# Patient Record
Sex: Female | Born: 1990 | Race: Black or African American | Hispanic: No | Marital: Single | Smoking: Current every day smoker
Health system: Southern US, Community
[De-identification: ages and names within clinical notes are randomized; demographics above are authoritative.]

## PROBLEM LIST (undated history)

## (undated) ENCOUNTER — Emergency Department: Admission: EM | Discharge status: Absent without leave | Payer: Self-pay

## (undated) DIAGNOSIS — N83209 Unspecified ovarian cyst, unspecified side: Secondary | ICD-10-CM

## (undated) DIAGNOSIS — K449 Diaphragmatic hernia without obstruction or gangrene: Secondary | ICD-10-CM

## (undated) DIAGNOSIS — I871 Compression of vein: Secondary | ICD-10-CM

## (undated) DIAGNOSIS — I10 Essential (primary) hypertension: Secondary | ICD-10-CM

## (undated) HISTORY — DX: Compression of vein: I87.1

## (undated) HISTORY — PX: ORTHOPEDIC SURGERY: SHX850

## (undated) HISTORY — DX: Essential (primary) hypertension: I10

## (undated) HISTORY — DX: Unspecified ovarian cyst, unspecified side: N83.209

## (undated) HISTORY — PX: DRUG INDUCED ENDOSCOPY: SHX6808

---
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
## 2011-01-17 ENCOUNTER — Emergency Department: Payer: Self-pay | Admitting: *Deleted

## 2011-04-11 ENCOUNTER — Emergency Department: Payer: Self-pay | Admitting: *Deleted

## 2011-04-17 ENCOUNTER — Ambulatory Visit: Payer: Self-pay | Admitting: Family Medicine

## 2011-05-10 ENCOUNTER — Emergency Department: Payer: Self-pay | Admitting: Emergency Medicine

## 2011-05-17 ENCOUNTER — Emergency Department: Payer: Self-pay

## 2011-08-27 ENCOUNTER — Inpatient Hospital Stay: Payer: Self-pay

## 2011-08-27 LAB — URINALYSIS, COMPLETE
Blood: NEGATIVE
Glucose,UR: 50 mg/dL (ref 0–75)
Ph: 6 (ref 4.5–8.0)
Protein: 100
RBC,UR: 2 /HPF (ref 0–5)
Squamous Epithelial: 4
WBC UR: 15 /HPF (ref 0–5)

## 2011-08-27 LAB — BASIC METABOLIC PANEL
Anion Gap: 11 (ref 7–16)
BUN: 7 mg/dL (ref 7–18)
Calcium, Total: 9.2 mg/dL (ref 9.0–10.7)
Chloride: 101 mmol/L (ref 98–107)
Co2: 27 mmol/L (ref 21–32)
EGFR (Non-African Amer.): >60
Glucose: 90 mg/dL (ref 65–99)
Osmolality: 275 (ref 275–301)
Potassium: 3.3 mmol/L — ABNORMAL LOW (ref 3.5–5.1)

## 2011-08-28 LAB — CBC WITH DIFFERENTIAL/PLATELET
Basophil %: 0 %
Eosinophil #: 0 10*3/uL (ref 0.0–0.7)
Eosinophil %: 0.3 %
HGB: 9.4 g/dL — ABNORMAL LOW (ref 12.0–16.0)
Lymphocyte #: 1.2 10*3/uL (ref 1.0–3.6)
MCH: 29.8 pg (ref 26.0–34.0)
MCHC: 33.8 g/dL (ref 32.0–36.0)
MCV: 88 fL (ref 80–100)
Monocyte #: 0.9 10*3/uL — ABNORMAL HIGH (ref 0.0–0.7)
Neutrophil #: 10.6 10*3/uL — ABNORMAL HIGH (ref 1.4–6.5)
Neutrophil %: 83.3 %
RDW: 11.9 % (ref 11.5–14.5)
WBC: 12.7 10*3/uL — ABNORMAL HIGH (ref 3.6–11.0)

## 2011-08-28 LAB — PROTIME-INR
INR: 1.1
Prothrombin Time: 14.3 secs (ref 11.5–14.7)

## 2011-08-29 LAB — CBC WITH DIFFERENTIAL/PLATELET
Eosinophil %: 0.4 %
HCT: 26.8 % — ABNORMAL LOW (ref 35.0–47.0)
HGB: 8.7 g/dL — ABNORMAL LOW (ref 12.0–16.0)
Lymphocyte #: 1.6 10*3/uL (ref 1.0–3.6)
MCH: 29.3 pg (ref 26.0–34.0)
Monocyte #: 0.6 10*3/uL (ref 0.0–0.7)
Monocyte %: 6.6 %
Neutrophil #: 7.5 10*3/uL — ABNORMAL HIGH (ref 1.4–6.5)
Neutrophil %: 76.5 %
Platelet: 217 10*3/uL (ref 150–440)
RBC: 2.96 10*6/uL — ABNORMAL LOW (ref 3.80–5.20)
RDW: 12.2 % (ref 11.5–14.5)

## 2011-08-29 LAB — HEPATIC FUNCTION PANEL A (ARMC)
Albumin: 2.8 g/dL — ABNORMAL LOW (ref 3.8–5.6)
Bilirubin, Direct: 0.2 mg/dL (ref 0.00–0.20)
Bilirubin,Total: 0.5 mg/dL (ref 0.2–1.0)
SGOT(AST): 21 U/L (ref 0–26)
SGPT (ALT): 25 U/L
Total Protein: 5.9 g/dL — ABNORMAL LOW (ref 6.4–8.6)

## 2011-08-29 LAB — LIPASE, BLOOD: Lipase: 135 U/L (ref 73–393)

## 2011-08-30 LAB — CBC WITH DIFFERENTIAL/PLATELET
Eosinophil #: 0.1 10*3/uL (ref 0.0–0.7)
Lymphocyte #: 1.8 10*3/uL (ref 1.0–3.6)
MCH: 29.6 pg (ref 26.0–34.0)
MCHC: 33.3 g/dL (ref 32.0–36.0)
MCV: 89 fL (ref 80–100)
Monocyte #: 0.7 10*3/uL (ref 0.0–0.7)
Monocyte %: 6.9 %
Neutrophil %: 74.2 %
Platelet: 231 10*3/uL (ref 150–440)
RDW: 12.4 % (ref 11.5–14.5)

## 2011-11-06 ENCOUNTER — Observation Stay: Payer: Self-pay | Admitting: Advanced Practice Midwife

## 2011-11-06 ENCOUNTER — Observation Stay: Payer: Self-pay

## 2011-11-07 ENCOUNTER — Inpatient Hospital Stay: Payer: Self-pay

## 2011-11-07 LAB — CBC WITH DIFFERENTIAL/PLATELET
Eosinophil #: 0 10*3/uL (ref 0.0–0.7)
HCT: 33.5 % — ABNORMAL LOW (ref 35.0–47.0)
HGB: 10.9 g/dL — ABNORMAL LOW (ref 12.0–16.0)
Lymphocyte #: 1.4 10*3/uL (ref 1.0–3.6)
Lymphocyte %: 12.4 %
MCH: 28.6 pg (ref 26.0–34.0)
MCHC: 32.6 g/dL (ref 32.0–36.0)
MCV: 88 fL (ref 80–100)
Monocyte %: 6.2 %
Neutrophil %: 80.9 %
Platelet: 214 10*3/uL (ref 150–440)
RBC: 3.83 10*6/uL (ref 3.80–5.20)
RDW: 13.1 % (ref 11.5–14.5)
WBC: 11 10*3/uL (ref 3.6–11.0)

## 2012-05-08 ENCOUNTER — Emergency Department: Payer: Self-pay | Admitting: Emergency Medicine

## 2012-05-08 LAB — COMPREHENSIVE METABOLIC PANEL
Alkaline Phosphatase: 75 U/L (ref 50–136)
Calcium, Total: 9.5 mg/dL (ref 8.5–10.1)
Co2: 23 mmol/L (ref 21–32)
EGFR (Non-African Amer.): >60
Osmolality: 271 (ref 275–301)
SGPT (ALT): 14 U/L (ref 12–78)
Sodium: 137 mmol/L (ref 136–145)

## 2012-05-08 LAB — URINALYSIS, COMPLETE
Bilirubin,UR: NEGATIVE
Glucose,UR: 50 mg/dL (ref 0–75)
Nitrite: POSITIVE
Protein: 100
RBC,UR: 1 /HPF (ref 0–5)
WBC UR: 15 /HPF (ref 0–5)

## 2012-05-08 LAB — CBC WITH DIFFERENTIAL/PLATELET
Basophil #: 0 10*3/uL (ref 0.0–0.1)
Basophil %: 0.2 %
Eosinophil #: 0 10*3/uL (ref 0.0–0.7)
Eosinophil %: 0 %
HGB: 11.7 g/dL — ABNORMAL LOW (ref 12.0–16.0)
Lymphocyte #: 0.8 10*3/uL — ABNORMAL LOW (ref 1.0–3.6)
Lymphocyte %: 5.4 %
MCH: 28.3 pg (ref 26.0–34.0)
MCHC: 33.2 g/dL (ref 32.0–36.0)
Monocyte #: 0.3 x10 3/mm (ref 0.2–0.9)
Neutrophil #: 13 10*3/uL — ABNORMAL HIGH (ref 1.4–6.5)
Neutrophil %: 92 %
Platelet: 407 10*3/uL (ref 150–440)
RBC: 4.15 10*6/uL (ref 3.80–5.20)
WBC: 14.2 10*3/uL — ABNORMAL HIGH (ref 3.6–11.0)

## 2012-05-08 LAB — HCG, QUANTITATIVE, PREGNANCY: Beta Hcg, Quant.: 33878 m[IU]/mL — ABNORMAL HIGH

## 2012-07-13 ENCOUNTER — Observation Stay: Payer: Self-pay | Admitting: Obstetrics and Gynecology

## 2012-07-13 LAB — URINALYSIS, COMPLETE
Bacteria: NONE SEEN
Bilirubin,UR: NEGATIVE
Glucose,UR: NEGATIVE mg/dL (ref 0–75)
Ph: 8 (ref 4.5–8.0)
Protein: NEGATIVE
RBC,UR: 1 /HPF (ref 0–5)
Specific Gravity: 1.017 (ref 1.003–1.030)
Squamous Epithelial: 7

## 2012-09-25 ENCOUNTER — Observation Stay: Payer: Self-pay | Admitting: Obstetrics and Gynecology

## 2012-09-26 ENCOUNTER — Observation Stay: Payer: Self-pay

## 2012-09-26 LAB — CBC WITH DIFFERENTIAL/PLATELET
Basophil #: 0 10*3/uL (ref 0.0–0.1)
Basophil %: 0.2 %
Eosinophil #: 0 10*3/uL (ref 0.0–0.7)
Eosinophil %: 0 %
Lymphocyte #: 1.2 10*3/uL (ref 1.0–3.6)
Lymphocyte %: 7.2 %
MCH: 28.7 pg (ref 26.0–34.0)
MCHC: 32.9 g/dL (ref 32.0–36.0)
Monocyte #: 0.8 x10 3/mm (ref 0.2–0.9)
Monocyte %: 4.8 %
Neutrophil #: 14.1 10*3/uL — ABNORMAL HIGH (ref 1.4–6.5)
Neutrophil %: 87.8 %
Platelet: 248 10*3/uL (ref 150–440)
RDW: 13 % (ref 11.5–14.5)

## 2012-09-26 LAB — URINALYSIS, COMPLETE
Blood: NEGATIVE
Leukocyte Esterase: NEGATIVE
Nitrite: NEGATIVE
Protein: 30
RBC,UR: <1 /HPF (ref 0–5)
Specific Gravity: 1.035 (ref 1.003–1.030)
Squamous Epithelial: 1

## 2012-09-26 LAB — HEPATIC FUNCTION PANEL A (ARMC)
Albumin: 2.9 g/dL — ABNORMAL LOW (ref 3.4–5.0)
Bilirubin, Direct: <0.1 mg/dL (ref 0.00–0.20)
SGOT(AST): 13 U/L — ABNORMAL LOW (ref 15–37)
SGPT (ALT): 11 U/L — ABNORMAL LOW (ref 12–78)

## 2012-09-26 LAB — COMPREHENSIVE METABOLIC PANEL
Albumin: 3.5 g/dL (ref 3.4–5.0)
BUN: 6 mg/dL — ABNORMAL LOW (ref 7–18)
Chloride: 104 mmol/L (ref 98–107)
Creatinine: 0.38 mg/dL — ABNORMAL LOW (ref 0.60–1.30)
EGFR (African American): >60
Glucose: 146 mg/dL — ABNORMAL HIGH (ref 65–99)
Potassium: 3.4 mmol/L — ABNORMAL LOW (ref 3.5–5.1)
SGOT(AST): 18 U/L (ref 15–37)
SGPT (ALT): 13 U/L (ref 12–78)
Total Protein: 7.3 g/dL (ref 6.4–8.2)

## 2012-09-26 LAB — AMYLASE: Amylase: 82 U/L (ref 25–115)

## 2012-09-27 LAB — URINALYSIS, COMPLETE
Bacteria: NONE SEEN
Bilirubin,UR: NEGATIVE
Glucose,UR: NEGATIVE mg/dL (ref 0–75)
Ketone: NEGATIVE
Nitrite: NEGATIVE
Ph: 9 (ref 4.5–8.0)
Protein: NEGATIVE
RBC,UR: 2 /HPF (ref 0–5)
Specific Gravity: 1.014 (ref 1.003–1.030)
Squamous Epithelial: <1
WBC UR: 1 /HPF (ref 0–5)

## 2012-09-27 LAB — GASTROCCULT (ARMC)
Occult Blood, Gastric: POSITIVE
Ph, Gastric: 4 (ref 1–3)

## 2012-09-28 LAB — CBC WITH DIFFERENTIAL/PLATELET
Eosinophil #: 0.1 10*3/uL (ref 0.0–0.7)
HCT: 28.6 % — ABNORMAL LOW (ref 35.0–47.0)
HGB: 9.6 g/dL — ABNORMAL LOW (ref 12.0–16.0)
MCH: 29.1 pg (ref 26.0–34.0)
MCHC: 33.6 g/dL (ref 32.0–36.0)
MCV: 87 fL (ref 80–100)
Monocyte #: 0.9 x10 3/mm (ref 0.2–0.9)
RBC: 3.3 10*6/uL — ABNORMAL LOW (ref 3.80–5.20)
WBC: 11.5 10*3/uL — ABNORMAL HIGH (ref 3.6–11.0)

## 2012-09-28 LAB — BASIC METABOLIC PANEL
Anion Gap: 6 — ABNORMAL LOW (ref 7–16)
BUN: 2 mg/dL — ABNORMAL LOW (ref 7–18)
Calcium, Total: 7.8 mg/dL — ABNORMAL LOW (ref 8.5–10.1)
Co2: 26 mmol/L (ref 21–32)
Creatinine: 0.43 mg/dL — ABNORMAL LOW (ref 0.60–1.30)
EGFR (African American): >60
EGFR (Non-African Amer.): >60
Glucose: 93 mg/dL (ref 65–99)
Osmolality: 270 (ref 275–301)
Potassium: 3.3 mmol/L — ABNORMAL LOW (ref 3.5–5.1)
Sodium: 137 mmol/L (ref 136–145)

## 2012-09-28 LAB — URINE CULTURE

## 2012-09-29 LAB — BASIC METABOLIC PANEL
Anion Gap: 5 — ABNORMAL LOW (ref 7–16)
BUN: 1 mg/dL — ABNORMAL LOW (ref 7–18)
EGFR (Non-African Amer.): >60
Glucose: 67 mg/dL (ref 65–99)
Osmolality: 273 (ref 275–301)
Potassium: 4 mmol/L (ref 3.5–5.1)

## 2012-09-29 LAB — CBC WITH DIFFERENTIAL/PLATELET
Basophil #: 0 10*3/uL (ref 0.0–0.1)
Basophil %: 0.4 %
Eosinophil #: 0.4 10*3/uL (ref 0.0–0.7)
HGB: 9.5 g/dL — ABNORMAL LOW (ref 12.0–16.0)
Lymphocyte %: 23.2 %
MCHC: 33.5 g/dL (ref 32.0–36.0)
MCV: 87 fL (ref 80–100)
Monocyte %: 7.6 %
Neutrophil #: 5.9 10*3/uL (ref 1.4–6.5)
Neutrophil %: 64.8 %
Platelet: 212 10*3/uL (ref 150–440)
RBC: 3.25 10*6/uL — ABNORMAL LOW (ref 3.80–5.20)
RDW: 13 % (ref 11.5–14.5)
WBC: 9.1 10*3/uL (ref 3.6–11.0)

## 2012-10-27 ENCOUNTER — Observation Stay: Payer: Self-pay | Admitting: Obstetrics and Gynecology

## 2012-10-27 LAB — URINALYSIS, COMPLETE
Blood: NEGATIVE
RBC,UR: 1 /HPF (ref 0–5)

## 2012-10-28 LAB — URINE CULTURE

## 2012-10-29 ENCOUNTER — Inpatient Hospital Stay: Payer: Self-pay | Admitting: Obstetrics and Gynecology

## 2012-10-29 DIAGNOSIS — I499 Cardiac arrhythmia, unspecified: Secondary | ICD-10-CM

## 2012-10-29 LAB — CBC WITH DIFFERENTIAL/PLATELET
Basophil #: 0 10*3/uL (ref 0.0–0.1)
Basophil %: 0.1 %
Eosinophil %: 0.2 %
HCT: 28.5 % — ABNORMAL LOW (ref 35.0–47.0)
Neutrophil %: 77 %
Platelet: 232 10*3/uL (ref 150–440)
RDW: 12.7 % (ref 11.5–14.5)

## 2012-10-29 LAB — BASIC METABOLIC PANEL
BUN: 5 mg/dL — ABNORMAL LOW (ref 7–18)
Co2: 28 mmol/L (ref 21–32)
Creatinine: 0.42 mg/dL — ABNORMAL LOW (ref 0.60–1.30)
EGFR (Non-African Amer.): >60
Osmolality: 267 (ref 275–301)
Potassium: 3.2 mmol/L — ABNORMAL LOW (ref 3.5–5.1)

## 2012-10-29 LAB — URINALYSIS, COMPLETE
Bilirubin,UR: NEGATIVE
Blood: NEGATIVE
Nitrite: NEGATIVE
Ph: 7 (ref 4.5–8.0)
Squamous Epithelial: 4
WBC UR: 3 /HPF (ref 0–5)

## 2012-10-29 LAB — GASTROCCULT (ARMC): Occult Blood, Gastric: POSITIVE

## 2012-10-30 LAB — CBC WITH DIFFERENTIAL/PLATELET
Basophil %: 0.3 %
HCT: 28 % — ABNORMAL LOW (ref 35.0–47.0)
HGB: 9.3 g/dL — ABNORMAL LOW (ref 12.0–16.0)
Lymphocyte #: 0.6 10*3/uL — ABNORMAL LOW (ref 1.0–3.6)
MCH: 28.8 pg (ref 26.0–34.0)
MCV: 87 fL (ref 80–100)
Neutrophil %: 78.6 %
Platelet: 190 10*3/uL (ref 150–440)
RBC: 3.23 10*6/uL — ABNORMAL LOW (ref 3.80–5.20)
RDW: 12.9 % (ref 11.5–14.5)
WBC: 6.9 10*3/uL (ref 3.6–11.0)

## 2012-10-30 LAB — COMPREHENSIVE METABOLIC PANEL
Albumin: 2.6 g/dL — ABNORMAL LOW (ref 3.4–5.0)
Anion Gap: 7 (ref 7–16)
Bilirubin,Total: 0.8 mg/dL (ref 0.2–1.0)
Creatinine: 0.49 mg/dL — ABNORMAL LOW (ref 0.60–1.30)
EGFR (African American): >60
EGFR (Non-African Amer.): >60
Glucose: 97 mg/dL (ref 65–99)
Osmolality: 270 (ref 275–301)
SGPT (ALT): 19 U/L (ref 12–78)
Sodium: 137 mmol/L (ref 136–145)
Total Protein: 5.7 g/dL — ABNORMAL LOW (ref 6.4–8.2)

## 2012-10-31 LAB — CBC WITH DIFFERENTIAL/PLATELET
Basophil %: 0.5 %
Eosinophil #: 0 10*3/uL (ref 0.0–0.7)
HCT: 30.2 % — ABNORMAL LOW (ref 35.0–47.0)
HGB: 10 g/dL — ABNORMAL LOW (ref 12.0–16.0)
Lymphocyte #: 0.6 10*3/uL — ABNORMAL LOW (ref 1.0–3.6)
Lymphocyte %: 13.4 %
MCV: 86 fL (ref 80–100)
Monocyte #: 0.6 x10 3/mm (ref 0.2–0.9)
Monocyte %: 13.6 %
Neutrophil #: 3.3 10*3/uL (ref 1.4–6.5)
Neutrophil %: 72.2 %
RBC: 3.51 10*6/uL — ABNORMAL LOW (ref 3.80–5.20)
RDW: 12.8 % (ref 11.5–14.5)

## 2012-10-31 LAB — BASIC METABOLIC PANEL
BUN: <1 mg/dL — ABNORMAL LOW (ref 7–18)
Calcium, Total: 8.7 mg/dL (ref 8.5–10.1)
EGFR (African American): >60
Glucose: 77 mg/dL (ref 65–99)
Potassium: 3.8 mmol/L (ref 3.5–5.1)

## 2012-11-03 ENCOUNTER — Inpatient Hospital Stay: Payer: Self-pay | Admitting: Obstetrics and Gynecology

## 2012-11-03 LAB — CBC WITH DIFFERENTIAL/PLATELET
Basophil #: 0 10*3/uL (ref 0.0–0.1)
Eosinophil #: 0 10*3/uL (ref 0.0–0.7)
HGB: 10.2 g/dL — ABNORMAL LOW (ref 12.0–16.0)
Lymphocyte #: 0.8 10*3/uL — ABNORMAL LOW (ref 1.0–3.6)
MCH: 28.1 pg (ref 26.0–34.0)
MCHC: 33 g/dL (ref 32.0–36.0)
MCV: 85 fL (ref 80–100)
Monocyte #: 0.3 x10 3/mm (ref 0.2–0.9)
Neutrophil %: 82.7 %
Platelet: 211 10*3/uL (ref 150–440)
RDW: 13 % (ref 11.5–14.5)

## 2012-11-03 LAB — BASIC METABOLIC PANEL
Anion Gap: 9 (ref 7–16)
BUN: 4 mg/dL — ABNORMAL LOW (ref 7–18)
Co2: 24 mmol/L (ref 21–32)
Creatinine: 0.32 mg/dL — ABNORMAL LOW (ref 0.60–1.30)
Osmolality: 272 (ref 275–301)

## 2012-11-03 LAB — GC/CHLAMYDIA PROBE AMP

## 2012-11-03 LAB — GASTROCCULT (ARMC): Ph, Gastric: 2 (ref 1–3)

## 2012-11-04 LAB — BASIC METABOLIC PANEL
BUN: 2 mg/dL — ABNORMAL LOW (ref 7–18)
Chloride: 108 mmol/L — ABNORMAL HIGH (ref 98–107)
Co2: 23 mmol/L (ref 21–32)
Creatinine: 0.4 mg/dL — ABNORMAL LOW (ref 0.60–1.30)
EGFR (African American): >60
Glucose: 98 mg/dL (ref 65–99)
Osmolality: 272 (ref 275–301)
Potassium: 3.6 mmol/L (ref 3.5–5.1)

## 2012-11-04 LAB — CBC WITH DIFFERENTIAL/PLATELET
Basophil #: 0 10*3/uL (ref 0.0–0.1)
Eosinophil #: 0 10*3/uL (ref 0.0–0.7)
Eosinophil %: 0.3 %
Lymphocyte %: 27.5 %
MCH: 28.5 pg (ref 26.0–34.0)
MCHC: 33.2 g/dL (ref 32.0–36.0)
MCV: 86 fL (ref 80–100)
Neutrophil #: 3.5 10*3/uL (ref 1.4–6.5)
Neutrophil %: 60.4 %
Platelet: 190 10*3/uL (ref 150–440)
RBC: 3.28 10*6/uL — ABNORMAL LOW (ref 3.80–5.20)
RDW: 13 % (ref 11.5–14.5)

## 2012-11-05 LAB — BASIC METABOLIC PANEL
Anion Gap: 8 (ref 7–16)
BUN: 2 mg/dL — ABNORMAL LOW (ref 7–18)
Calcium, Total: 8.5 mg/dL (ref 8.5–10.1)
Chloride: 108 mmol/L — ABNORMAL HIGH (ref 98–107)
Co2: 23 mmol/L (ref 21–32)
Creatinine: 0.57 mg/dL — ABNORMAL LOW (ref 0.60–1.30)
EGFR (African American): >60
EGFR (Non-African Amer.): >60
Glucose: 82 mg/dL (ref 65–99)
Osmolality: 273 (ref 275–301)
Potassium: 3.5 mmol/L (ref 3.5–5.1)

## 2012-11-06 LAB — HEMATOCRIT: HCT: 26.3 % — ABNORMAL LOW (ref 35.0–47.0)

## 2013-02-27 ENCOUNTER — Emergency Department: Payer: Self-pay | Admitting: Emergency Medicine

## 2013-02-27 LAB — CBC WITH DIFFERENTIAL/PLATELET
Basophil #: 0 10*3/uL (ref 0.0–0.1)
Eosinophil %: 0 %
HCT: 39.7 % (ref 35.0–47.0)
HGB: 13.4 g/dL (ref 12.0–16.0)
Lymphocyte #: 2 10*3/uL (ref 1.0–3.6)
MCV: 83 fL (ref 80–100)
Monocyte #: 0.6 x10 3/mm (ref 0.2–0.9)
Neutrophil %: 73.8 %
RBC: 4.8 10*6/uL (ref 3.80–5.20)

## 2013-02-27 LAB — URINALYSIS, COMPLETE
Bilirubin,UR: NEGATIVE
Nitrite: NEGATIVE
RBC,UR: 52 /HPF (ref 0–5)

## 2013-02-27 LAB — LIPASE, BLOOD: Lipase: 77 U/L (ref 73–393)

## 2013-02-27 LAB — COMPREHENSIVE METABOLIC PANEL
Alkaline Phosphatase: 74 U/L (ref 50–136)
BUN: 10 mg/dL (ref 7–18)
Bilirubin,Total: 0.7 mg/dL (ref 0.2–1.0)
Co2: 32 mmol/L (ref 21–32)
Creatinine: 0.75 mg/dL (ref 0.60–1.30)
EGFR (Non-African Amer.): >60
Sodium: 139 mmol/L (ref 136–145)

## 2013-04-28 ENCOUNTER — Emergency Department: Payer: Self-pay | Admitting: Emergency Medicine

## 2013-04-28 LAB — COMPREHENSIVE METABOLIC PANEL
Albumin: 5.3 g/dL — ABNORMAL HIGH (ref 3.4–5.0)
Alkaline Phosphatase: 83 U/L
Anion Gap: 5 — ABNORMAL LOW (ref 7–16)
Bilirubin,Total: 0.9 mg/dL (ref 0.2–1.0)
Calcium, Total: 10.4 mg/dL — ABNORMAL HIGH (ref 8.5–10.1)
EGFR (African American): >60
EGFR (Non-African Amer.): >60
Glucose: 133 mg/dL — ABNORMAL HIGH (ref 65–99)
Osmolality: 274 (ref 275–301)
Potassium: 3.2 mmol/L — ABNORMAL LOW (ref 3.5–5.1)
SGPT (ALT): 23 U/L (ref 12–78)
Total Protein: 9.9 g/dL — ABNORMAL HIGH (ref 6.4–8.2)

## 2013-04-28 LAB — CBC WITH DIFFERENTIAL/PLATELET
Basophil #: 0 10*3/uL (ref 0.0–0.1)
Eosinophil #: 0 10*3/uL (ref 0.0–0.7)
Eosinophil %: 0 %
Lymphocyte #: 1.3 10*3/uL (ref 1.0–3.6)
MCH: 28.1 pg (ref 26.0–34.0)
MCHC: 33.2 g/dL (ref 32.0–36.0)
MCV: 85 fL (ref 80–100)
Monocyte #: 0.8 x10 3/mm (ref 0.2–0.9)
Neutrophil %: 82.6 %
RDW: 13.6 % (ref 11.5–14.5)

## 2013-04-28 LAB — LIPASE, BLOOD: Lipase: 105 U/L (ref 73–393)

## 2013-04-29 LAB — URINALYSIS, COMPLETE
Bilirubin,UR: NEGATIVE
Glucose,UR: NEGATIVE mg/dL (ref 0–75)
Hyaline Cast: 16
Nitrite: NEGATIVE
RBC,UR: 5 /HPF (ref 0–5)
WBC UR: 4 /HPF (ref 0–5)

## 2013-04-29 LAB — ETHANOL
Ethanol %: <0.003 % (ref 0.000–0.080)
Ethanol: <3 mg/dL

## 2013-12-08 ENCOUNTER — Emergency Department: Payer: Self-pay | Admitting: Emergency Medicine

## 2013-12-09 LAB — CBC WITH DIFFERENTIAL/PLATELET
Basophil #: 0.1 10*3/uL (ref 0.0–0.1)
Basophil %: 0.8 %
Eosinophil #: 0.1 10*3/uL (ref 0.0–0.7)
Eosinophil %: 0.5 %
HCT: 35.1 % (ref 35.0–47.0)
HGB: 11.4 g/dL — ABNORMAL LOW (ref 12.0–16.0)
Lymphocyte #: 1.7 10*3/uL (ref 1.0–3.6)
Lymphocyte %: 15.6 %
MCH: 28.5 pg (ref 26.0–34.0)
MCHC: 32.4 g/dL (ref 32.0–36.0)
MCV: 88 fL (ref 80–100)
Monocyte #: 0.4 x10 3/mm (ref 0.2–0.9)
Monocyte %: 3.6 %
NEUTROS PCT: 79.5 %
Neutrophil #: 8.7 10*3/uL — ABNORMAL HIGH (ref 1.4–6.5)
PLATELETS: 297 10*3/uL (ref 150–440)
RBC: 3.99 10*6/uL (ref 3.80–5.20)
RDW: 12 % (ref 11.5–14.5)
WBC: 11 10*3/uL (ref 3.6–11.0)

## 2013-12-09 LAB — URINALYSIS, COMPLETE
BLOOD: NEGATIVE
Bilirubin,UR: NEGATIVE
Glucose,UR: NEGATIVE mg/dL (ref 0–75)
Ketone: NEGATIVE
LEUKOCYTE ESTERASE: NEGATIVE
Nitrite: NEGATIVE
PH: 7 (ref 4.5–8.0)
PROTEIN: NEGATIVE
RBC,UR: <1 /HPF (ref 0–5)
Specific Gravity: 1.016 (ref 1.003–1.030)
Squamous Epithelial: 21
WBC UR: 4 /HPF (ref 0–5)

## 2013-12-09 LAB — BASIC METABOLIC PANEL
Anion Gap: 6 — ABNORMAL LOW (ref 7–16)
BUN: 6 mg/dL — AB (ref 7–18)
CALCIUM: 8.4 mg/dL — AB (ref 8.5–10.1)
CO2: 25 mmol/L (ref 21–32)
Chloride: 108 mmol/L — ABNORMAL HIGH (ref 98–107)
Creatinine: 0.75 mg/dL (ref 0.60–1.30)
EGFR (African American): >60
GLUCOSE: 94 mg/dL (ref 65–99)
Osmolality: 275 (ref 275–301)
POTASSIUM: 3.4 mmol/L — AB (ref 3.5–5.1)
Sodium: 139 mmol/L (ref 136–145)

## 2013-12-09 LAB — TROPONIN I: Troponin-I: <0.02 ng/mL

## 2014-02-10 IMAGING — US ABDOMEN ULTRASOUND
1 series · 14 of 25 positions shown · non-contrast
Comparison: none

REASON FOR EXAM: Look for gallstones, pt will not be NPO for 8 hours
COMMENTS:

[Series 1: abdomen ultrasound · 0.31mm/px · 14 of 77 slices shown]
[im 1/77]
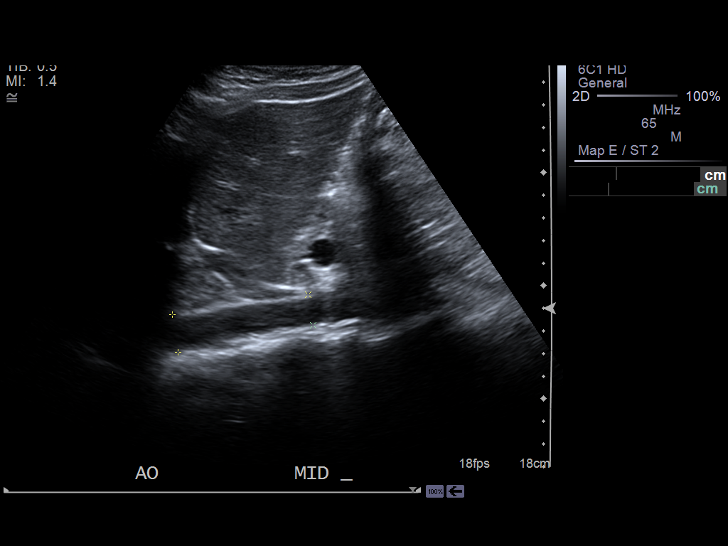
[im 7/77]
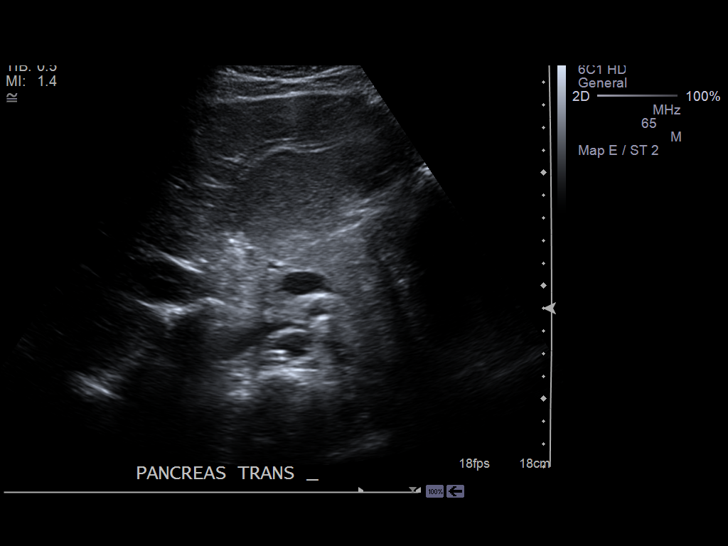
[im 13/77]
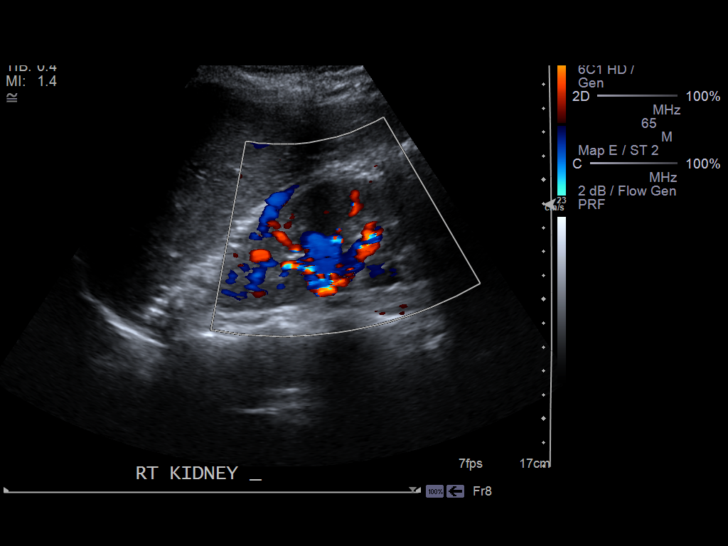
[im 20/77]
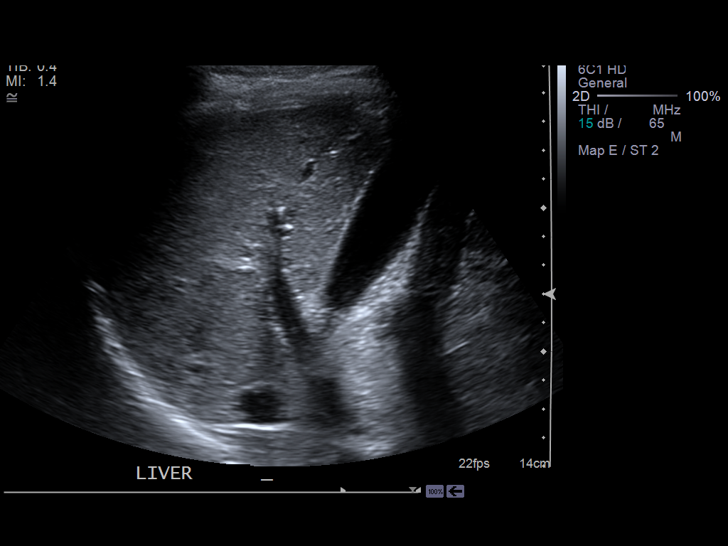
[im 26/77]
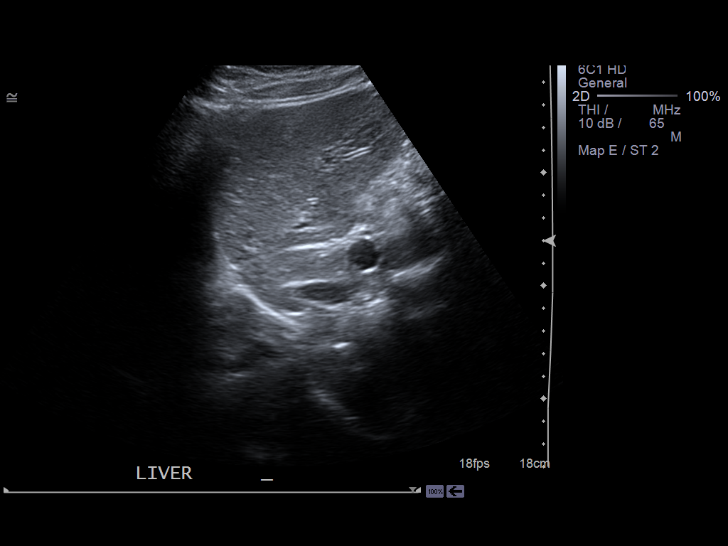
[im 29/77]
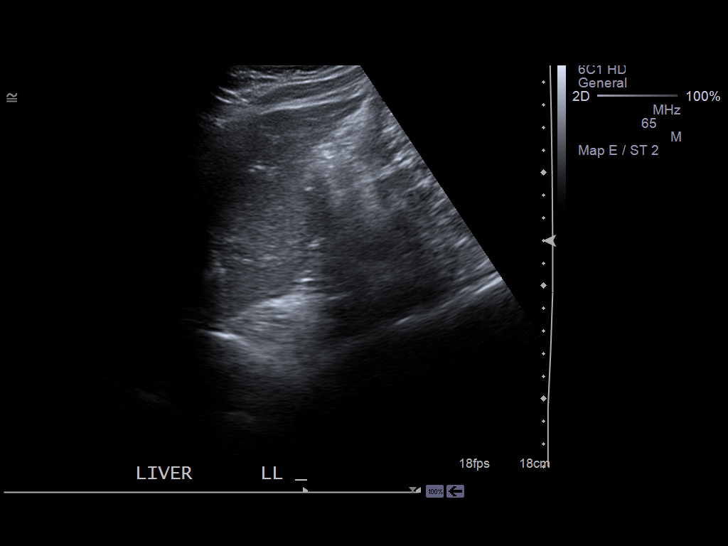
[im 35/77]
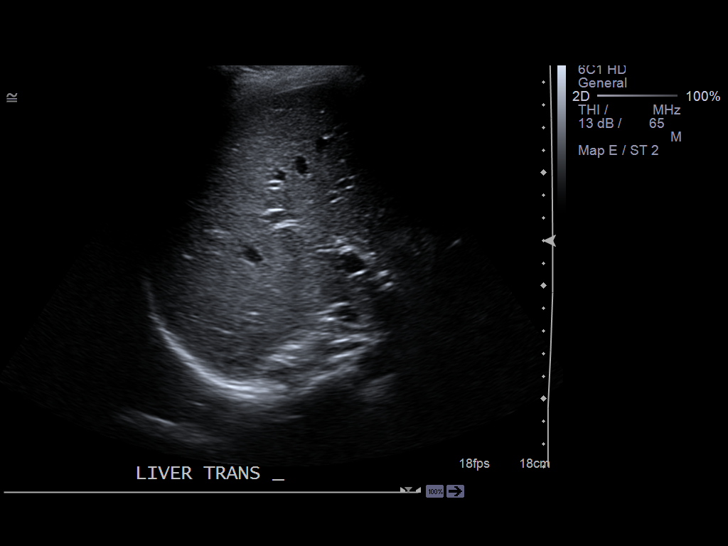
[im 42/77]
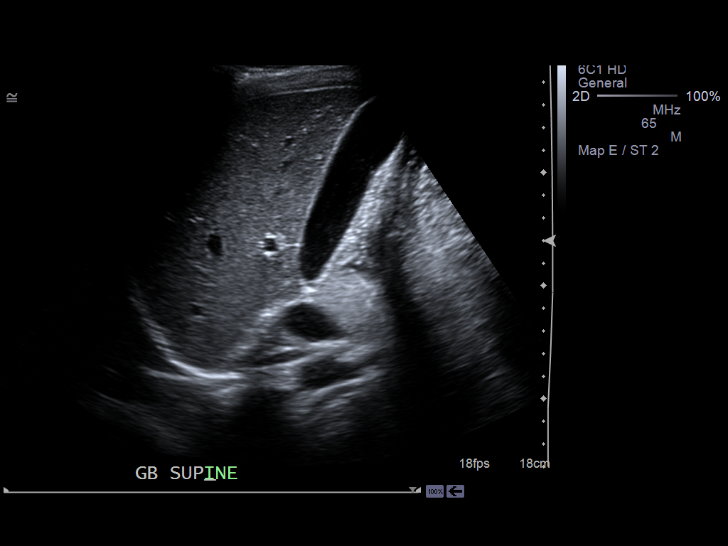
[im 48/77]
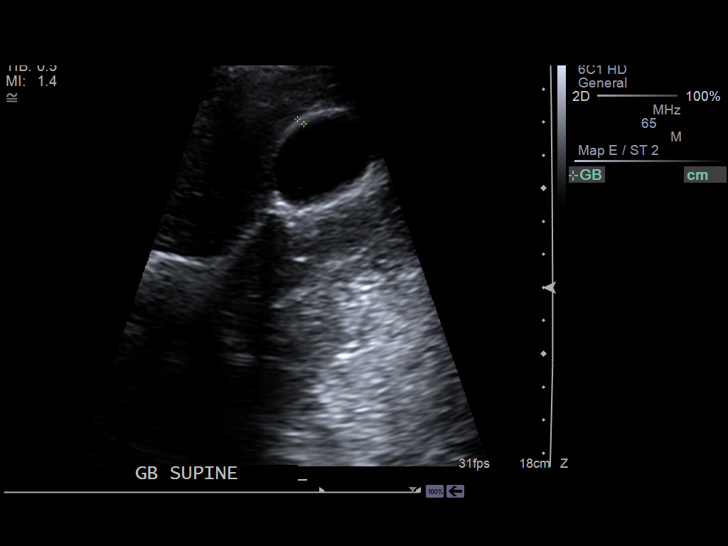
[im 51/77]
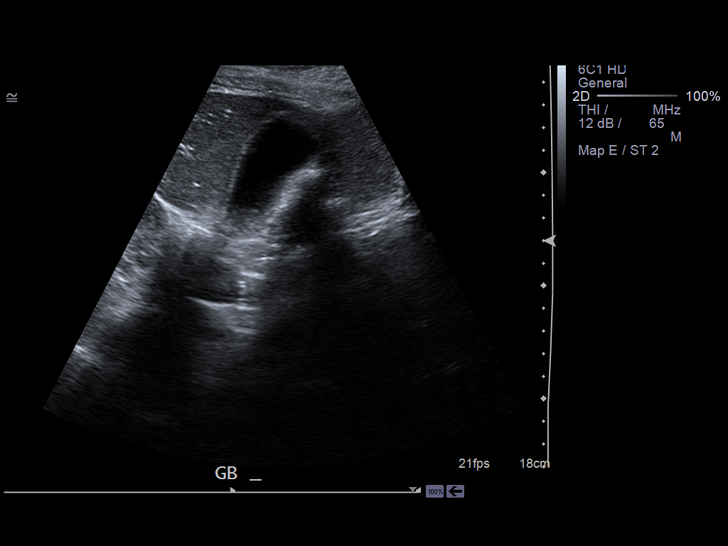
[im 58/77]
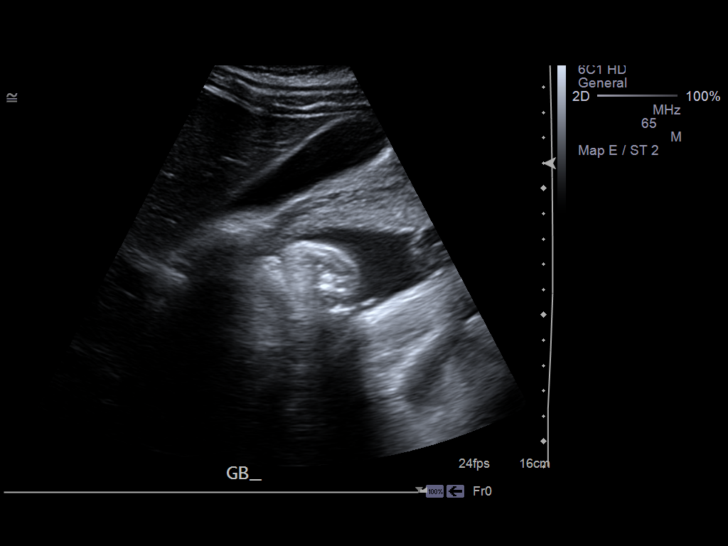
[im 64/77]
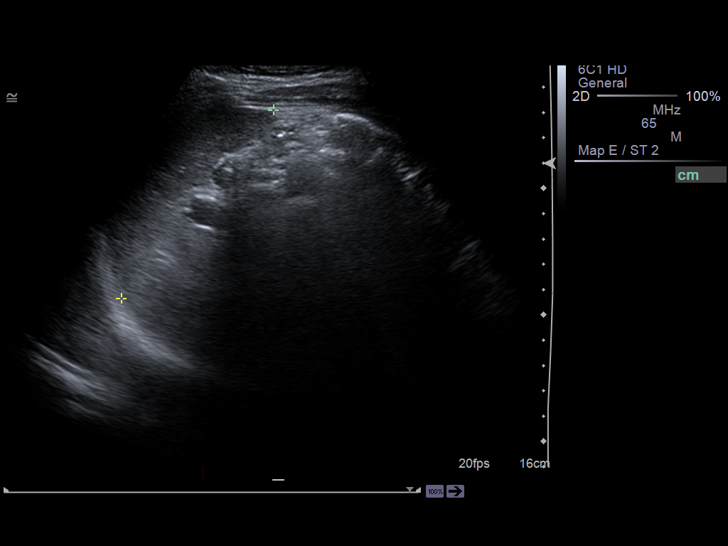
[im 70/77]
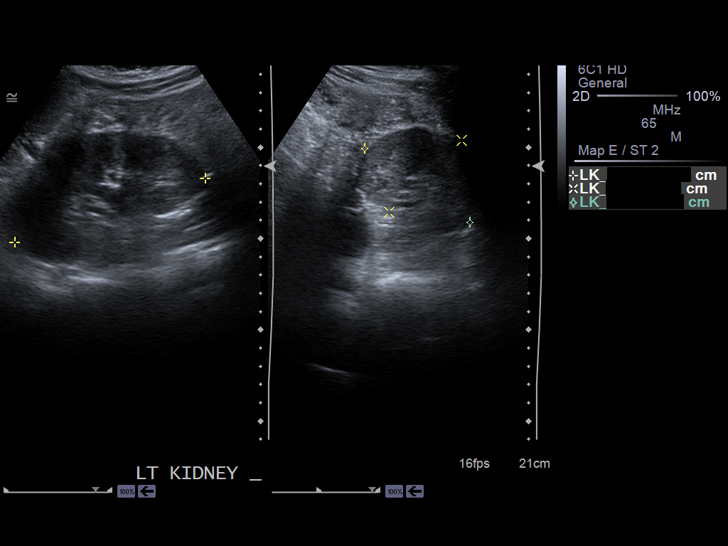
[im 77/77]
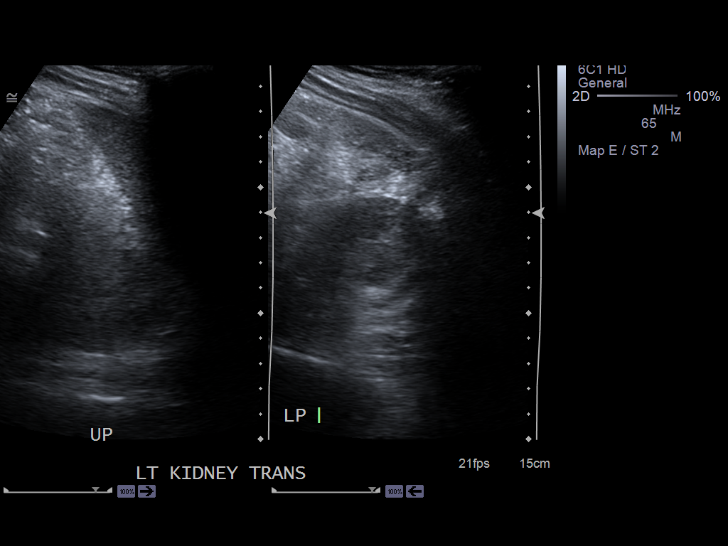

[14 of 25 positions shown; findings below may reference images not displayed]

PROCEDURE:     US  - US ABDOMEN GENERAL SURVEY  - October 29, 2012 [DATE]

RESULT:     Abdominal ultrasound demonstrates a normal appearance of the
abdominal aorta. The visualized pancreas appears normal. The tail is
incompletely seen area the right kidney measures 10.58 x 5.01 x 4.57 cm with
a cortical thickness measured at 1.03 cm. The liver length is 13.92 cm. The
liver shows a normal echotexture. There is no discrete mass or ductal
dilation. The portal venous flow is normal. Common bile duct diameter is
to 2.4 mm. No gallstones are evident. Gallbladder wall thickness is 2.4 mm.
There is no pericholecystic fluid. A negative sonographic Murphy's sign is
reported. The spleen shows a normal echotexture measuring 9.57 cm. The left
kidney measures 11.02 x 5.59 x 7.05 cm with a cortical thickness of 1.28 cm.
There is no ascites.
IMPRESSION: Normal appearing abdominal sonogram.

[REDACTED]

## 2014-09-18 NOTE — Consult Note (Signed)
CC: hematemesis, vomiting, heartburn.  Pt to be induced soon.  I will likely do EGD Wednesday depending on the condition of the patient.   Electronic Signatures: Scot JunElliott, Fredi Geiler T (MD)  (Signed on 09-Jun-14 19:31)  Authored  Last Updated: 09-Jun-14 19:31 by Scot JunElliott, Valena Ivanov T (MD)

## 2014-09-18 NOTE — Consult Note (Signed)
CC: hematemesis.   Pt had esophagitis and focal gastritis on EGD.  Should be on omeprazole 20mg  bid, avoid eating and lying down.  Should eat verys soft diet.  Will see in follow up in 3-4 weeks.  May go home tomorrow.  i will be out of town starting tomorrow morning.  Electronic Signatures: Scot JunElliott, Amadi Frady T (MD)  (Signed on 11-Jun-14 12:02)  Authored  Last Updated: 11-Jun-14 12:02 by Scot JunElliott, Zendayah Hardgrave T (MD)

## 2014-09-18 NOTE — Consult Note (Signed)
PATIENT NAME:  Kimberly Hansen, Kimberly Hansen MR#:  [id number] DATE OF BIRTH:  18-Jan-1991  GASTROENTEROLOGY CONSULTATION  DATE OF CONSULTATION:  09/28/2012  CONSULTING PHYSICIAN:  Ezzard StandingPaul Y. Aldair Rickel, MD  REASON FOR REFERRAL: Nausea and vomiting, chest pain, and hematemesis.   The patient is a 24 year old black female who is approximately 2131 weeks' pregnant who comes in with nausea and vomiting after eating dinner. She also had a few episodes of what appears to be hematemesis as well. She has been experiencing a lot of epigastric chest discomfort along with some odynophagia. The patient was immediately started on Prilosec, 40 mg daily. Despite this, she still has this pressure in her chest that has not gone away. The nausea and vomiting has stopped.   The patient had a similar episode last year when she was pregnant. She was seen by Dr. Marva PandaSkulskie at the time. The patient was given nausea medication and a PPI, which relieved the symptoms. She does not recall being placed on medication after discharge. She was not on anything when she presented to the Emergency Room for her reflux.   The patient has no other past medical history.   MEDICATIONS: The only medications she is on is prenatal vitamins.   She had no surgical history.   She has no allergies to medication.   She does not smoke or drink.   REVIEW OF SYMPTOMS: There are no fevers or chills. She thought she lost weight, but her weight has actually gone up through her pregnancy. There is no coughing or shortness of breath. She does have some chest pain, but no palpitations. She did have some odynophagia, but denies dysphagia. She denies any low abdominal pain or cramping. There is no diarrhea or constipation. There is no hematochezia or melena.   The rest of the review of symptoms is negative.   PHYSICAL EXAMINATION: GENERAL: The patient is in no acute distress.  VITAL SIGNS: Her vital signs are stable.  HEENT: Normocephalic, atraumatic head. Pupils are  equally reactive. Throat was clear.  NECK: Supple.  CARDIAC: Showed a regular rhythm rate without murmurs.  LUNGS: Clear bilaterally.  ABDOMEN: Normoactive bowel sounds. Soft, nontender. She is gravid. She had active bowel sounds.  EXTREMITIES: Showed no clubbing, cyanosis, or edema.   LABS: Sodium 137, potassium 3.3; this is from May 3rd. Creatinine 0.43, chloride 105. Liver enzymes were normal.   White count is 11.5, hemoglobin was 9.6. Urine is negative.   Hepatitis A is negative. Hepatitis Hansen core antibody is negative. Gastroccult for vomiting, emesis was positive for blood. PH was 4.   ASSESSMENT AND PLAN: This is a 24 year old female who is 1631 weeks' pregnant, who has nausea, vomiting, and hematemesis and chest pain. This is likely to be related to gastroesophageal reflux disease which often worsens during pregnancy/the later part of pregnancy. It is possible with all the retching she may have a small Mallory-Weiss tear. She could very well have reflux esophagitis as well. The symptoms are similar to last year when she was pregnant. So far she has not responded to Prilosec. Therefore I switched the medication to Protonix IV twice a day. If there is no relief I can add Carafate, 1 gram 4 times a day before meals. I would not recommend endoscopy right now because of her pregnancy. Sedation can either induce labor or may have potential harm to the baby. Certainly, if the symptoms worsen where she is having active bleeding, we can reconsider endoscopy. I doubt that will  be necessary.  I recommend that she continue with the nausea medication plus at least daily proton pump inhibitor, even when she is ready for discharge at home. She may need it at least throughout the duration of her pregnancy. I will continue to follow the patient. Thank you for the referral.    ____________________________ Ezzard Standing. Bluford Kaufmann, MD pyo:dm D: 09/29/2012 08:19:00 ET T: 09/29/2012 08:46:40 ET JOB#: [id number]  cc: Ezzard Standing. Bluford Kaufmann,  MD, <Dictator> Ezzard Standing Laporscha Linehan MD ELECTRONICALLY SIGNED 09/30/2012 13:19

## 2014-09-18 NOTE — Consult Note (Signed)
Chief Complaint:  Subjective/Chief Complaint Feeling much better. Less CP. Tolerating liquid diet.   VITAL SIGNS/ANCILLARY NOTES: **Vital Signs.:   04-May-14 09:31  Vital Signs Type Routine  Temperature Temperature (F) 98  Celsius 36.6  Temperature Source oral  Pulse Pulse 105  Respirations Respirations 18  Systolic BP Systolic BP 106  Diastolic BP (mmHg) Diastolic BP (mmHg) 69  Mean BP 81   Brief Assessment:  Cardiac Regular   Respiratory clear BS   Gastrointestinal gravid. nontender   Lab Results: Routine Micro:  02-May-14 11:22   Micro Text Report URINE CULTURE   COMMENT                   NO GROWTH IN 18-24 HOURS   ANTIBIOTIC                       Specimen Source i/o cath  Culture Comment NO GROWTH IN 18-24 HOURS  Result(s) reported on 28 Sep 2012 at 03:17PM.  Routine UA:  02-May-14 11:22   Color (UA) Yellow  Clarity (UA) Clear  Glucose (UA) Negative  Bilirubin (UA) Negative  Ketones (UA) Negative  Specific Gravity (UA) 1.014  Blood (UA) Negative  pH (UA) 9.0  Protein (UA) Negative  Nitrite (UA) Negative  Leukocyte Esterase (UA) Negative (Result(s) reported on 27 Sep 2012 at 11:51AM.)  RBC (UA) 2 /HPF  WBC (UA) 1 /HPF  Bacteria (UA) NONE SEEN  Epithelial Cells (UA) <1 /HPF  Mucous (UA) PRESENT (Result(s) reported on 27 Sep 2012 at 11:51AM.)   Assessment/Plan:  Assessment/Plan:  Assessment GERD. Much better on protonix bid. IV fell. PO protonix given this AM.   Plan Advance diet. If tolerates solids, ok for discharge later today. Wrote script for protonix 40mg  bid qac. May need nausea meds prn. Have patient f/u with us in several weeks. If doing well, may decrease protonix to once daily for rest of her pregnancy. Will sign off. Thanks.   Electronic Signatures: Lutricia Feilh, Arhum Peeples (MD)  (Signed [phone]-May-14 10:12)  Authored: Chief Complaint, VITAL SIGNS/ANCILLARY NOTES, Brief Assessment, Lab Results, Assessment/Plan   Last Updated: 04-May-14 10:12 by Lutricia Feilh, Talishia Betzler  (MD)

## 2014-09-18 NOTE — Consult Note (Signed)
Pt seen and examined. Full consult to follow. [redacted] wk pregnant female with nausea/vomiting/hematemesis/chest pain. Likely GERD sxs. May have small M-W esophageal tear or reflux esophagitis. Similar sxs last year during pregnancy. Started po prilosec yest. So far, no relief. Switched to protonix iv bid. If no relief, will add carafate 1 qid qac. I would NOT recommend EGD right now but willing to do so later if sxs worsen over time. Recommend nausea meds plus PPI daily when ready for discharge. Will follow. Thanks.  Electronic Signatures: Lutricia Feilh, Zayde Stroupe (MD)  (Signed on 03-May-14 09:59)  Authored  Last Updated: 03-May-14 09:59 by Lutricia Feilh, Chasin Findling (MD)

## 2014-09-18 NOTE — Discharge Summary (Signed)
PATIENT NAME:  Princess BruinsJENNINGS, Trenton B MR#:  [id number] DATE OF BIRTH:  09-18-1990  NARRATIVE SUMMARY  DATE OF ADMISSION:  10/29/2012 DATE OF DISCHARGE:  10/31/2012  PRINCIPAL DIAGNOSES:  1.  Severe gastritis.  2.  Hematemesis.  3.  Hypokalemia.   DISCHARGE DIAGNOSES: 1.  Severe gastritis, with resolving symptoms 2.  Hematemesis, with resolving symptoms.  3.  Hypokalemia, with resolving symptoms.   HOSPITAL COURSE: The patient was admitted with a 2-day history of nausea, vomiting, and some blood-streaked vomitus. The patient was admitted to postpartum for IV hydration. The patient's potassium was initially low at 3.2; required potassium supplementation intravenously. On discharge, potassium of 3.8, sodium 138. The patient's nausea and vomiting resolved with intravenous fluids, and IV Phenergan and Zofran. The patient had no vomitus the last 24 hours.   The patient is discharged to home in good condition. The patient will follow up and be admitted to the hospital on June 09th in the early evening for induction, for June 10th she will be 37 weeks at that time. The patient is aware of the risk and benefits of early induction versus continuation with her severe gastritis. The patient will be discharged to home on Protonix, and Zofran and Phenergan. Precautions were given to the patient.   The patient will undergo a non-stress test before discharge today.    ____________________________ Suzy Bouchardhomas J. Anet Logsdon, MD tjs:dm D: 10/31/2012 14:31:18 ET T: 10/31/2012 15:33:12 ET JOB#: [id number]  cc: Suzy Bouchardhomas J. Jaeli Grubb, MD, <Dictator> Suzy BouchardHOMAS J Hanif Radin MD ELECTRONICALLY SIGNED 11/08/2012 20:58

## 2014-09-20 NOTE — Discharge Summary (Signed)
PATIENT NAME:  Kimberly Hansen, Letishia B MR#:  [id number] DATE OF BIRTH:  [birth year]  DATE OF ADMISSION:  08/27/2011 DATE OF DISCHARGE:  09/01/2011  HISTORY OF PRESENT ILLNESS: This is a 24 year old gravida 1 whose EDC is 11/20/2011. The patient is being followed at [facility] for this pregnancy. The patient had hyperemesis the earlier part of this pregnancy. The patient presented with a 24-hour history of increasing nausea and vomiting where she is vomiting every 30 minutes.   PAST MEDICAL HISTORY:  MEDICAL ILLNESS: Denied.   SURGERY: Denied.   ALLERGIES: Denied.   MEDICATIONS: Prenatal vitamins.   HOSPITAL COURSE: The patient was admitted, was seen through kind consultation with the GI service. The patient ultimately responded to her therapy and was discharged on April 5th in excellent condition.   DIET: Regular diet.  DISCHARGE INSTRUCTIONS: The patient is to return to the [facility] for follow-up care.   ____________________________ Deloris PingPhilip J. Luella Cookosenow, MD pjr:drc D: 09/26/2011 10:02:04 ET T: 09/26/2011 10:59:24 ET JOB#: [id number]  cc: Deloris PingPhilip J. Luella Cookosenow, MD, <Dictator> Towana BadgerPHILIP J ROSENOW MD ELECTRONICALLY SIGNED 09/30/2011 15:53

## 2014-09-20 NOTE — Consult Note (Signed)
Chief Complaint:   Subjective/Chief Complaint c/o continued nausea, 2 episodes of emesis today, no bright red hematemesis.  continues with generalized abdominal pain/discomfort. Marland Kitchen.   VITAL SIGNS/ANCILLARY NOTES: **Vital Signs.:   02-Apr-13 15:35   Vital Signs Type Routine   Temperature Temperature (F) 99.3   Celsius 37.3   Temperature Source oral   Pulse Pulse 89   Pulse source per Dinamap   Respirations Respirations 18   Systolic BP Systolic BP 119   Diastolic BP (mmHg) Diastolic BP (mmHg) 58   Mean BP 78   BP Source Dinamap    16:30   Vital Signs Type Routine   Temperature Temperature (F) 98.7   Celsius 37   Temperature Source oral   Pulse Pulse 78   Pulse source per Dinamap   Respirations Respirations 18   Systolic BP Systolic BP 108   Diastolic BP (mmHg) Diastolic BP (mmHg) 62   Mean BP 77   BP Source Dinamap   Fetal Heart Tones  156   Brief Assessment:   Cardiac Regular    Respiratory clear BS    Gastrointestinal details normal Soft  Nondistended  No masses palpable  Bowel sounds normal  No rebound tenderness  No gaurding  mild tenderness across entire abdomen, gravid   Routine Hem:  02-Apr-13 04:33    WBC (CBC) 9.8   RBC (CBC) 2.96   Hemoglobin (CBC) 8.7   Hematocrit (CBC) 26.8   Platelet Count (CBC) 217   MCV 91   MCH 29.3   MCHC 32.4   RDW 12.2   Neutrophil % 76.5   Lymphocyte % 16.2   Monocyte % 6.6   Eosinophil % 0.4   Basophil % 0.3   Neutrophil # 7.5   Lymphocyte # 1.6   Monocyte # 0.6   Eosinophil # 0.0   Basophil # 0.0   Assessment/Plan:  Assessment/Plan:   Assessment 1) epigastric discomfort in the setting of recent hematemesis/coffeeground emesis.  denies heartburn or dysphagia    Plan 1) continue daily ppi, will change miralax to daily.  will check lfts and lipase, if abnormal consider abd us.   Electronic Signatures: Barnetta ChapelSkulskie, Chevelle Durr (MD)  (Signed 02-Apr-13 18:05)  Authored: Chief Complaint, VITAL SIGNS/ANCILLARY NOTES, Brief  Assessment, Lab Results, Assessment/Plan   Last Updated: 02-Apr-13 18:05 by Barnetta ChapelSkulskie, Ryanne Morand (MD)

## 2014-09-20 NOTE — Consult Note (Signed)
Chief Complaint:   Subjective/Chief Complaint Please see full GI consult.  Patietn admitted with n/v and hematemesis. Several small volume coffeeground episodes today, no abdominal pain of dysphagia (eating ice chips).  Hemodynamically stable.  Probable small mallory-weiss tear in the distal esophagus versus reflux related esophagitis.  Was on iv ranitidine.  Will change to iv protonix as a more effective acid blocker.  No plans for upper scope unless there is a substantial clinical change.  Serial hgb.  Would type and screen.  Following.   VITAL SIGNS/ANCILLARY NOTES: **Vital Signs.:   01-Apr-13 16:30   Vital Signs Type Admission   Temperature Temperature (F) 98.8   Celsius 37.1   Temperature Source oral   Pulse Pulse 90   Pulse source per Dinamap   Respirations Respirations 18   Systolic BP Systolic BP 100   Diastolic BP (mmHg) Diastolic BP (mmHg) 62   Mean BP 74   BP Source Dinamap   Pulse Ox % Pulse Ox % 100   Pulse Ox Activity Level  At rest   Oxygen Delivery Room Air/ 21 %   Fetal Heart Tones  150   Electronic Signatures for Addendum Section:  Barnetta ChapelSkulskie, Emmogene Simson (MD) (Signed Addendum 01-Apr-13 17:43)  Case discussed with Marta Antuamara Brothers, CNM.   Electronic Signatures: Barnetta ChapelSkulskie, Eean Buss (MD)  (Signed 01-Apr-13 17:41)  Authored: Chief Complaint, VITAL SIGNS/ANCILLARY NOTES   Last Updated: 01-Apr-13 17:43 by Barnetta ChapelSkulskie, Jeramie Scogin (MD)

## 2014-09-20 NOTE — Consult Note (Signed)
Brief Consult Note: Diagnosis: NV.   Patient was seen by consultant.   Consult note dictated.   Discussed with Attending MD.   Comments: Appreciate consult for 24 y/o African American G2P0 woman 26 6/[redacted] wk pregnant for bloody vomiting. States that she had a history of hyperemesis during her 1st trimester, but it resolved once she hit 18w in pregnancy. States her current symptoms started Friday afternoon smelling burnt hot dogs. Reports vomiting multiple times- emesis started out as food-stuffs, then turned yellow, green, then somewhat bloody, now described as brown. Last emesis was 0900 today. States her nausea and vomiting have improved with the Zofran. Also states that she has had some heartburn and acid reflux, has been taking some Tums once or twice weekly for this: did receive a dose of Zantac today that helped to improve it. Does state some mild mid abdominal discomfort. Denies NSAIDs, black tarry stools, diarrhea, constipation, problems swallowing, and all other GI related complaints. Says that [facility], she was told she was anemic, but hasn't been started on Fe yet. No history of luminal evaluation. Denies other issues with pregnancy complications, FHTs on monitor were 150. VSS. Impression: Kimberly ArtistMallory Weiss tear. At this point patient is clinically stable and condition has improved. Would continue around the clock anti-emetic for the next 48-72hr and observation. Will add PPI to therapy and follow. She may require PPI therapy through her pregancy with history of acid reflux and heartburn, although will need to stop this if she plans to nurse her child  Electronic Signatures: Vevelyn PatLondon, Lamarr Feenstra H (NP)  (Signed 01-Apr-13 14:12)  Authored: Brief Consult Note   Last Updated: 01-Apr-13 14:12 by Keturah BarreLondon, Niajah Sipos H (NP)

## 2014-09-20 NOTE — Consult Note (Signed)
Chief Complaint:   Subjective/Chief Complaint feeling better today, tolerated full liquids, including mashed potato.  less abd discomfort and nausea, no emesis over 24 hours. no bm yet, but passing flatus.   VITAL SIGNS/ANCILLARY NOTES: **Vital Signs.:   04-Apr-13 11:44   Vital Signs Type Routine   Temperature Temperature (F) 98.5   Celsius 36.9   Temperature Source oral   Pulse Pulse 89   Respirations Respirations 20   Systolic BP Systolic BP 112   Diastolic BP (mmHg) Diastolic BP (mmHg) 68   Mean BP 82   BP Source Dinamap   Brief Assessment:   Cardiac Regular    Respiratory clear BS    Gastrointestinal details normal Soft  Nontender  Nondistended  No masses palpable  Bowel sounds normal  gravid   Assessment/Plan:  Assessment/Plan:   Assessment 1) n/v hematemesis-improved, tolerating po.  currently on daily ppi.    Plan 1) will advance to low residue diet tomorrow.  continue daily 40 mg protonix, change to po tomorrow. continue daily miralax.   Electronic Signatures: Barnetta ChapelSkulskie, Tiger Spieker (MD)  (Signed 04-Apr-13 16:49)  Authored: Chief Complaint, VITAL SIGNS/ANCILLARY NOTES, Brief Assessment, Assessment/Plan   Last Updated: 04-Apr-13 16:49 by Barnetta ChapelSkulskie, Kalianne Fetting (MD)

## 2014-09-20 NOTE — Consult Note (Signed)
PATIENT NAME:  Kimberly Hansen, [name] MR#:  [id number] DATE OF BIRTH:  1991-03-23  DATE OF CONSULTATION:  08/28/2011  REFERRING PHYSICIAN:  Deloris PingPhilip J. Luella Cookosenow, MD  CONSULTING PHYSICIAN:  Barnetta ChapelMartin Skulskie, MD/Ihor Meinzer Denman GeorgeH. Aleiyah Halpin, NP  HISTORY OF PRESENT ILLNESS: Ms. Kimberly Hansen is a 24 year old G2, P0 African American woman who is 26-6/[redacted] weeks pregnant. GI has been consulted at the request of Dr. Luella Cookosenow for evaluation of bloody emesis.   HISTORY OF PRESENT ILLNESS: Ms. Kimberly Hansen states to me that she had a history of hyperemesis during her first trimester but it resolved once she hit 23 weeks in her pregnancy. She states her current symptoms started Friday afternoon after smelling burnt hotdogs, reports vomiting multiple times. Emesis started out as foodstuffs then turned yellow-green and then somewhat bloody, now described as brown. Last emesis was at 09:00 today. She states her nausea and vomiting have improved with the Zofran. Also states that she has had some heartburn and acid reflux. She did receive a dose of Zantac today that helped to improve it. Does state some mild midabdominal discomfort. Denies NSAIDs, black tarry stools, diarrhea, constipation, problems swallowing and all other GI-related complaints. She says that at her last visit [facility] for her prenatal care she was told she was anemic but has not been started on iron yet. No history of luminal evaluation. Denies further issues with pregnancy complications. Fetal heart tones today on the monitor were 150. The patient's vital signs are stable. Her hemoglobin is currently 9.4. Her platelet count is normal and she has a normal PT, INR.   ALLERGIES: Multiple food allergies including peanuts. Denies drug allergies.   MEDICATIONS: Prenatal vitamins.  MEDICAL ISSUES: Denies medical and surgical health issues.   FAMILY HISTORY: Noncontributory at this time.   SOCIAL HISTORY: Lives with her grandfather. Smokes occasional tobacco  cigarette. Denies domestic violence. No alcohol or illicits.   REVIEW OF SYSTEMS: GENERAL: Some fatigue. No history of fevers. No reported depression or anxiety. HEAD: No complaints of headaches, migraines. SKIN: No complaints of jaundice, rash, or itching. HEENT: No complaints of vision changes, redness, drainage, itching to her eyes, mouth sores, hoarseness, swollen neck, nosebleeds. PULMONARY: No complaints of shortness of breath, cough, wheezing. CARDIAC: No chest pain, palpitations, murmur, high blood pressure, tachycardia. EXTREMITIES: No complaints of muscle and joint pain, swelling or discoloration. HEMATOLOGIC/ENDOCRINE: States her GTT was negative. No thyroid problems. Anemia as reported. NEUROLOGIC: No numbness, tingling, fainting, dizziness, or seizures. GI: As noted.  PHYSICAL EXAMINATION:   MOST RECENT VITAL SIGNS: Temperature 98.1, pulse 86, respiratory rate 16, blood pressure 112/60.   MOST RECENT LABS: Glucose 90, BUN 7, creatinine 0.44, potassium 3.3, chloride 101, GFR greater than 60, calcium 9.2, WBC 12.7, hemoglobin 9.4, hematocrit 27.9, platelet count 252. Red cells are normocytic. PT 14.3. INR 1.1.   GENERAL: Well appearing pregnant African American female in no acute distress.   PSYCH: Pleasant, cooperative. Thoughts logical. Mood stable.   HEENT: Normocephalic, atraumatic. Oral mucous membranes pink and moist. No redness, drainage, or inflammation to the eyes or the nares.   NECK: No lymphadenopathy, thyromegaly, or JVD.   RESPIRATORY: Respirations eupneic. Lungs CTAB.   CARDIOVASCULAR: S1, S2, regular rate and rhythm. No MRG. Peripheral pulses 2+ bilaterally. No appreciable edema.   ABDOMEN: Rounded, pregnant appearing abdomen, soft. Bowel sounds x4. Mild tenderness to the umbilicus. No peritoneal signs, rebound tenderness, hepatosplenomegaly, or hernias noted.   RECTAL: Deferred.   GU: Deferred.   MUSCULOSKELETAL: MAEW x4. Sensation intact. No  clubbing or  cyanosis noted.   SKIN: Warm, dry, pink. No erythema, lesion, or rash.   NEUROLOGIC: Alert and oriented x3. Cranial nerves II through XII grossly intact. PERRL, 3 mm bilaterally.   IMPRESSION AND PLAN: Mallory-Weiss tear. At this point the patient is clinically stable and condition has improved. Would continue around-the-clock antiemetics for the next 48 to 72 hours on observation. Will add PPI therapy and follow. She may require PPI therapy through her pregnancy with history of acid reflux and heartburn although will need to stop this if she plans to nurse her child.   These services were provided by Vevelyn Pat, MSN, NPC in collaboration with Barnetta Chapel, MD.  ____________________________ Keturah Barre, NP chl:drc D: 08/28/2011 14:04:00 ET T: 08/28/2011 14:21:52 ET JOB#: [id number]  cc: Keturah Barre, NP, <Dictator> Eustaquio Maize Arijana Narayan FNP ELECTRONICALLY SIGNED 08/28/2011 19:41

## 2014-10-06 NOTE — H&P (Signed)
L&D Evaluation:  History:  HPI 24 y/o G2P1001 [facility] 36[facility]wks [facility] for scheduled IOL per TJS due to severe gastritis and hematemesis. No nausea or vomiting for the past 36 hours, has had clear liquids today without problem. Reflux continues but not worsening sx. Occasional irritabillity, denies leaking fluid or vaginal bleeding, baby is active. Care @ [facility], GBS unknown   Presents with above   Patient's Medical History No Chronic Illness   Patient's Surgical History none   Medications Pre Natal Vitamins   Allergies NKDA, peanuts   Social History none   Family History Non-Contributory   ROS:  ROS All systems were reviewed.  HEENT, CNS, GI, GU, Respiratory, CV, Renal and Musculoskeletal systems were found to be normal.   Exam:  Vital Signs stable   Urine Protein not completed   General no apparent distress   Mental Status clear   Chest clear   Heart normal sinus rhythm   Abdomen gravid, non-tender   Estimated Fetal Weight Average for gestational age   Fetal Position vtx   Fundal Height S=D   Back no CVAT   Edema no edema   Reflexes 1+   Clonus negative   Pelvic no external lesions, cx posterior 1cm thick BOWI no bloody show   Mebranes Intact   FHT normal rate with no decels, baseline 130's 140's avg variability with accels   Fetal Heart Rate 136   Ucx irregular   Skin dry   Lymph no lymphadenopathy   Impression:  Impression IOL @ term  recent Severe gastritis   Plan:  Plan monitor contractions and for cervical change, antibiotics for GBBS prophylaxis   Comments Pt from mother baby to L&D explained what to expect with IOL. First pregnancy delivered 1 year ago, rapid progress. DC pain management options, will request epidural with labor progress. Dr Logan BoresEvans consulted this am, in agreement with plan of care. Dr Mechele CollinElliott has been to see pt this evening on mother baby, will reevaluate this wednesday and may schedule EGD.   Electronic  Signatures: Albertina ParrLugiano, Jarron Curley B (CNM)  (Signed 09-Jun-14 22:42)  Authored: L&D Evaluation   Last Updated: 09-Jun-14 22:42 by Albertina ParrLugiano, Lucindia Lemley B (CNM)

## 2014-10-06 NOTE — H&P (Signed)
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 Pt has seen GI for issues and will be f/u after delivery for a poss tear. Pt has been vomiting tonight and feels poorly and says she is "very sick". No ROM, VB or decreased FM. Pt has some UC's and cx is 1/70%/vtx. No complaining of pain in abd but, has had UC's. Pt has not had a BM x 5 days and normally goes 5 x a day.   Presents with contractions   Patient's Medical History No Chronic Illness   Patient's Surgical History none   Medications Pre Natal Vitamins   Allergies Peanuts, fresh fruits and vegetables   Social History none   Family History Non-Contributory   ROS:  ROS All systems were reviewed.  HEENT, CNS, GI, GU, Respiratory, CV, Renal and Musculoskeletal systems were found to be normal.   Exam:  Vital Signs stable   General no apparent distress   Mental Status clear   Chest clear   Heart Irreg Rhythm, no M/R/G   Abdomen gravid, non-tender   Estimated Fetal Weight Average for gestational age   Fetal Position vtx   Back no CVAT   Edema 2+   Mebranes Intact   FHT normal rate with no decels, other, Variable decel to 90's witih quick recovery, reactice NST with 2 accels 15 x 15 bpm   Ucx irregular   Skin dry   Lymph no lymphadenopathy   Impression:  Impression IUP at 36 weeks with N,V   Plan:  Plan EKG, US, labs   Comments Pt feels like she "cannot go on" and feels that she is near death". Pt states she can take meds at home only if she has something on her stomach and that is a challenge at times for her. ADvised that we cannot induce without a medical indication and that also Dr Feliberto GottronSchermerhorn would have to make that dec ision.   Electronic Signatures: Sharee PimpleJones, Price Lachapelle W (CNM)  (Signed 03-Jun-14  08:44)  Authored: L&D Evaluation   Last Updated: 03-Jun-14 08:44 by Sharee PimpleJones, Nyeshia Mysliwiec W (CNM)

## 2014-10-06 NOTE — H&P (Signed)
L&D Evaluation:  History Expanded:   HPI 24 yo G1 whose EDC = 11/20/11, presents at 9538 1/7 weeks with c/o contractions. Pt was evaluated yesterday with same complaints, dilated 1 cm. Given Morphine 4 mg IM, but states she wasn't able to rest. [facility], [facility]. History notable for BJ'sMallory Weis tear during pregnancy.    Blood Type A positive    Group B Strep Results (Result >5wks must be treated as unknown) positive    Maternal HIV Negative    Maternal Syphilis Ab Nonreactive    Maternal Varicella Immune    Rubella Results immune    Maternal T-Dap Immune    Patient's Medical History Mallory Weis tear    Patient's Surgical History none    Medications Pre Natal Vitamins    Allergies NKDA    Social History tobacco  infequently   Exam:   Vital Signs stable    General no apparent distress    Mental Status clear    Abdomen gravid, tender with contractions    Estimated Fetal Weight Average for gestational age    Back no CVAT    Reflexes 2+    Pelvic no external lesions, 3-4 per RN    Mebranes Intact    FHT normal rate with no decels    Ucx irregular   Impression:   Impression active labor, NST- reactive   Plan:   Plan antibiotics for GBBS prophylaxis    Comments Admission for probable delivery as cervix changed from 1 cm last night.   Electronic Signatures: Kimberly Hansen, Kimberly Hansen (CNM)  (Signed 11-Jun-13 05:21)  Authored: L&D Evaluation   Last Updated: 11-Jun-13 05:21 by Kimberly Hansen, Bryona Foxworthy Hansen (CNM)

## 2014-10-06 NOTE — H&P (Signed)
L&D Evaluation:  History:  HPI 24 yo G2P1 @ 36+4 with persistent hyperemesis. Hematemesis last sunday and repeat dark brown vomitus. Nothing to eat or drink > 24 hours.   Presents with abdominal pain, N/V   Patient's Medical History No Chronic Illness  GERD   Patient's Surgical History tonsilectomy   Medications Pre Natal Vitamins  phenergan, zofran   Allergies NKDA   Social History none   Family History Non-Contributory   Exam:  Vital Signs stable   General no apparent distress   Mental Status clear   Chest clear   Heart normal sinus rhythm   Abdomen gravid, non-tender   Back no CVAT   Edema no edema   Pelvic 1 cm/ 60/-3/ vtx   Mebranes Intact   FHT normal rate with no decels   FHT Description reactive,   Fetal Heart Rate 150   Ucx regular   Ucx Frequency 4 min   Impression:  Impression Hyperemesis, hematemesis, anemia , 36+ [redacted]weeks EGA   Plan:  Plan admit cont IVf . Given severeity of her disease will consider  delivery in 2-3 days ( 37+0)  If HCT cont to drop will reconsult GI. Hypokalemia will cont to replace with KCL. rrepeat labs in am.   Electronic Signatures: Prisila Dlouhy, Ihor Austinhomas J (MD)  (Signed 03-Jun-14 16:04)  Authored: L&D Evaluation   Last Updated: 03-Jun-14 16:04 by Suzy BouchardSchermerhorn, Lamonda Noxon J (MD)

## 2014-10-06 NOTE — H&P (Signed)
L&D Evaluation:  History Expanded:   HPI 24 yo G1 whose EDC = 11/20/11, presents at 38 weeks with c/o contractions. Pt was evaluated earlier today with same complaints, dilated 1 cm. Reports ctx increased in frequency through the day. [facility], [facility]. History notable for BJ'sMallory Weis tear during pregnancy.    Blood Type A positive    Group B Strep Results (Result >5wks must be treated as unknown) positive    Maternal HIV Negative    Maternal Syphilis Ab Nonreactive    Maternal Varicella Immune    Rubella Results immune    Maternal T-Dap Immune    Patient's Medical History Mallory Weis tear    Patient's Surgical History none    Medications Pre Natal Vitamins    Allergies NKDA    Social History tobacco  infequently   Exam:   Vital Signs stable    General no apparent distress    Mental Status clear    Abdomen gravid, tender with contractions    Estimated Fetal Weight Average for gestational age    Back no CVAT    Reflexes 2+    Pelvic no external lesions, 1.5 per RN    Mebranes Intact    FHT normal rate with no decels    Ucx irregular   Impression:   Impression early labor, NST- reactive   Plan:   Plan discharge    Comments Will plan to give Morphine 4 mg IM before d/c for therapeutic rest   Electronic Signatures: Vella KohlerBrothers, Oris Staffieri K (CNM)  (Signed 10-Jun-13 20:16)  Authored: L&D Evaluation   Last Updated: 10-Jun-13 20:16 by Vella KohlerBrothers, Javi Bollman K (CNM)

## 2014-10-06 NOTE — H&P (Signed)
L&D Evaluation:  History:  HPI 24 y/o G2P1001 @31 Merrie Roof/[facility]wks [facility] arrived last night with c/o repeatedly vomiting after eating large meal for dinner. No diarrhea, body aches or temperature elevation.denied leaking fluid, vaginal bleeding or contractions, baby is active. IV fluids/phenergan, zofran with relief. Care @ [facility].   Presents with nausea/vomiting   Patient's Medical History No Chronic Illness   Patient's Surgical History none   Medications Pre Natal Vitamins   Allergies NKDA, food allergies and bees   Social History none   Family History Non-Contributory   ROS:  ROS All systems were reviewed.  HEENT, CNS, GI, GU, Respiratory, CV, Renal and Musculoskeletal systems were found to be normal.   Exam:  Vital Signs stable   Urine Protein not completed   FHT normal rate with no decels, baseline 130's 140's avg variability with accels   Fetal Heart Rate 144   Ucx rare   Impression:  Impression 32 weeks with Vomiting   Plan:  Comments Soundly sleeping this am, no vomiting since 0530. Not awakened by CNM (VS earlier by RN). VSS AF FHR reactive, rare uc's. Will let rest and reevaluate, plan to dc at lunchtime if improved.   Electronic Signatures: Albertina ParrLugiano, Mally Gavina B (CNM)  (Signed 30-Apr-14 08:53)  Authored: L&D Evaluation   Last Updated: 30-Apr-14 08:53 by Albertina ParrLugiano, Gilbert Manolis B (CNM)

## 2014-10-06 NOTE — H&P (Signed)
L&D Evaluation:  History Expanded:   HPI 24 yo G1 whose EDC = 11/20/11.  [facility].  Pt had hyperemesis earlier this pregnancy.  Pt presents wth 24 hour Hx of increasing N+V, to the point that she is vomiting q 30 minutes.  Perhaps bloody?    Blood Type A positive    Group B Strep Results (Result >5wks must be treated as unknown) positive    Maternal HIV Negative    Maternal Syphilis Ab Nonreactive    Rubella Results immune    Patient's Medical History No Chronic Illness    Patient's Surgical History none    Medications Pre Natal Vitamins    Allergies NKDA    Social History tobacco  infequently   Exam:   Vital Signs stable    General ill appearing    Mental Status clear    Chest clear    Heart normal sinus rhythm    Abdomen gravid, non-tender, NBS, non-tender    Estimated Fetal Weight Average for gestational age    Back no CVAT   Impression:   Impression dehydration, N+V   Plan:   Plan UA, UA, met B, zofran    Comments Will have GI see in am   Electronic Signatures: Rosina Lowenstein (MD)  (Signed 31-Mar-13 20:01)  Authored: L&D Evaluation   Last Updated: 31-Mar-13 20:01 by Rosina Lowenstein (MD)

## 2014-10-06 NOTE — H&P (Signed)
L&D Evaluation:  History:  HPI 24 y/o P0 BF Hyperemesis this gestation with hematemesis -- GI eval this week   Presents with nausea/vomiting, hematemesis   Patient's Medical History No Chronic Illness   Medications Pre Natal Vitamins  protonix, phenergan, zofran   Allergies NKDA   Social History none   Family History Non-Contributory   ROS:  ROS All systems were reviewed.  HEENT, CNS, GI, GU, Respiratory, CV, Renal and Musculoskeletal systems were found to be normal., emesis x 5 last pm; once today   Exam:  Vital Signs stable   General no apparent distress   Mental Status clear   Abdomen gravid, non-tender   Mebranes Intact   FHT normal rate with no decels   Ucx irregular, irritability   Skin dry   Impression:  Impression dehydration, N/V, small hematemesis (known)   Plan:  Plan fluids   Comments IVF anti-emetics F/U with us and GI this week as planned   Electronic Signatures: Margaretha GlassingEvans, Ricky L (MD)  (Signed 01-Jun-14 11:55)  Authored: L&D Evaluation   Last Updated: 01-Jun-14 11:55 by Margaretha GlassingEvans, Ricky L (MD)

## 2014-10-06 NOTE — H&P (Signed)
L&D Evaluation:  History:  HPI 24 yo G2P1001 with LMP of ?02/21/12 & US at 11 weeks with EDD of 11/26/12 presents  [facility] with a 4 day hx of vomiting since Monday pm after eating dinner and feeling sick. Pt vomited 500 mls' upon arrival to hospital. Pt was seen Tues am in the ER and hydrated and told she had food poisoning. Denies aching, chills, fever, HA, +nausea, no diarrhea, no other members of the family are ill. No ROM, VB, decreased FM or any signs of labor. Pt states she feels she is going to die from vomiting so much and believes she has lost weight. Last weight was 138 to 140. Pt states she is refusing to go home until she is eating.   Presents with nausea/vomiting   Patient's Medical History No Chronic Illness  [facility], [place] 2011 tx   Patient's Surgical History none   Medications Pre Natal Vitamins   Allergies NKDA, food allergies and bees   Social History none   Family History Non-Contributory   ROS:  ROS All systems were reviewed.  HEENT, CNS, GI, GU, Respiratory, CV, Renal and Musculoskeletal systems were found to be normal.   Exam:  Vital Signs stable  128/81   General no apparent distress   Mental Status clear   Chest clear   Heart normal sinus rhythm, no murmur/gallop/rubs   Abdomen gravid, non-tender   Estimated Fetal Weight Average for gestational age   Fetal Position Breech   Back no CVAT   Reflexes 1+   Mebranes Intact   FHT Cat I strip, +accels, 1 decel to 90 noted with return to baseline   Ucx uterine irritability   Skin dry   Lymph no lymphadenopathy   Impression:  Impression IUP at 31 1/7 weeks with food poisoning vs viral illness   Plan:  Plan Hydration, labs, zofran   Electronic Signatures: Sharee PimpleJones, Jesse Hirst W (CNM)  (Signed 01-May-14 12:04)  Authored: L&D Evaluation   Last Updated: 01-May-14 12:04 by Sharee PimpleJones, Wilmont Olund W (CNM)

## 2014-10-06 NOTE — H&P (Signed)
L&D Evaluation:  History:  HPI 24 yo G2P1 at 20+3 weeks c/o infraumbilical pain starting this morning at midnight. Pt thinks she may be constipated . no bm this . No vag d/c , no bleeding. U/s today with prelim no abnormalities. cervix closed. EDC 11/28/12 based on 11+0 weeks u/s   Presents with abdominal pain   Patient's Medical History No Chronic Illness   Patient's Surgical History none   Allergies NKDA   Social History none   Family History Non-Contributory   ROS:  ROS All systems were reviewed.  HEENT, CNS, GI, GU, Respiratory, CV, Renal and Musculoskeletal systems were found to be normal.   Exam:  Vital Signs stable   General no apparent distress   Chest clear   Heart normal sinus rhythm   Abdomen slight periumbilical TTP , no rebound   Edema no edema   Pelvic cervix closed and thick, by RN   Mebranes Intact   FHT 163   Impression:  Impression adb pain , probable constipation at 20+3  weeks. No prenatal care yet.   Plan:  Plan conservative tx, dulcolax or magnesium citrate. Add metamucil/ Pt will call [facility] to initiate PNC this week.   Electronic Signatures: Schermerhorn, Ihor Austinhomas J (MD)  (Signed 15-Feb-14 16:15)  Authored: L&D Evaluation   Last Updated: 15-Feb-14 16:15 by Suzy BouchardSchermerhorn, Thomas J (MD)

## 2018-02-25 ENCOUNTER — Other Ambulatory Visit: Payer: Self-pay

## 2018-02-25 ENCOUNTER — Emergency Department
Admission: EM | Admit: 2018-02-25 | Discharge: 2018-02-25 | Disposition: A | Discharge status: Home / Self Care | Payer: Self-pay | Attending: Student in an Organized Health Care Education/Training Program | Admitting: Student in an Organized Health Care Education/Training Program

## 2018-02-25 ENCOUNTER — Emergency Department: Payer: Self-pay

## 2018-02-25 DIAGNOSIS — S62634A Displaced fracture of distal phalanx of right ring finger, initial encounter for closed fracture: Secondary | ICD-10-CM | POA: Insufficient documentation

## 2018-02-25 DIAGNOSIS — Y929 Unspecified place or not applicable: Secondary | ICD-10-CM | POA: Insufficient documentation

## 2018-02-25 DIAGNOSIS — Y999 Unspecified external cause status: Secondary | ICD-10-CM | POA: Insufficient documentation

## 2018-02-25 DIAGNOSIS — Y939 Activity, unspecified: Secondary | ICD-10-CM | POA: Insufficient documentation

## 2018-02-25 DIAGNOSIS — S62639A Displaced fracture of distal phalanx of unspecified finger, initial encounter for closed fracture: Secondary | ICD-10-CM

## 2018-02-25 DIAGNOSIS — F17200 Nicotine dependence, unspecified, uncomplicated: Secondary | ICD-10-CM | POA: Insufficient documentation

## 2018-02-25 DIAGNOSIS — X500XXA Overexertion from strenuous movement or load, initial encounter: Secondary | ICD-10-CM | POA: Insufficient documentation

## 2018-02-25 MED ORDER — IBUPROFEN 800 MG PO TABS
800.0000 mg | ORAL_TABLET | Freq: Once | ORAL | Status: AC
  Administered 2018-02-25: 800 mg via ORAL
  Filled 2018-02-25: qty 1

## 2018-02-25 MED ORDER — IBUPROFEN 800 MG PO TABS: 800.0000 mg | ORAL_TABLET | Freq: Three times a day (TID) | ORAL | 0 refills | Status: AC | PRN

## 2018-02-25 MED ORDER — TRAMADOL HCL 50 MG PO TABS: 50.0000 mg | ORAL_TABLET | Freq: Three times a day (TID) | ORAL | 0 refills | Status: AC | PRN

## 2018-02-25 NOTE — Discharge Instructions (Addendum)
You have a fracture to the the distal fingertip of the ring finger. The ligament is likely torn. Wear the finger splint to promote healing and protect the finger. Apply ice to reduce swelling. You may follow-up with orthopedics for further evaluation. You can expect pain & swelling and bruising for a few weeks.

## 2018-02-25 NOTE — ED Notes (Signed)
See triage note  Presents with pain to right ring finger  States she caught it and it was bent back  Swelling noted

## 2018-02-25 NOTE — ED Provider Notes (Signed)
[facility] Emergency Department Provider Note ____________________________________________  Time seen: 1052  I have reviewed the triage vital signs and the nursing notes.  HISTORY  Chief Complaint  Finger Injury  HPI Kimberly Hansen is a 27 y.o. female who presents herself to the ED for evaluation of pain and disability to the right ring finger.  She describes she went to grab something last night in the dark, and her right fourth digit fingertip got caught on something and bitten backwards, and a hyper extension injury.  Since that time she is had pain, swelling, and now bruising to the DIP and the middle phalanx patient denies any other injury at this time.  She reports pain, stiffness, and inability to move the finger at that joint.  No nail injury or laceration is reported.  History reviewed. No pertinent past medical history.  There are no active problems to display for this patient.  History reviewed. No pertinent surgical history.  Prior to Admission medications   Medication Sig Start Date End Date Taking? Authorizing Provider  ibuprofen (ADVIL,MOTRIN) 800 MG tablet Take 1 tablet (800 mg total) by mouth every 8 (eight) hours as needed for up to 10 days. 02/25/18 03/07/18  Herma Uballe, Charlesetta Ivory, PA-C  traMADol (ULTRAM) 50 MG tablet Take 1 tablet (50 mg total) by mouth 3 (three) times daily as needed for up to 5 days. 02/25/18 03/02/18  Rochester Serpe, Charlesetta Ivory, PA-C    Allergies Peanut-containing drug products  No family history on file.  Social History Social History   Tobacco Use  . Smoking status: Current Every Day Smoker  . Smokeless tobacco: Never Used  Substance Use Topics  . Alcohol use: Not Currently  . Drug use: Not Currently    Review of Systems  Constitutional: Negative for fever. Cardiovascular: Negative for chest pain. Respiratory: Negative for shortness of breath. Musculoskeletal: Negative for back pain.  Right ring finger  pain as above. Skin: Negative for rash. Neurological: Negative for headaches, focal weakness or numbness. ____________________________________________  PHYSICAL EXAM:  VITAL SIGNS: ED Triage Vitals  Enc Vitals Group     BP 02/25/18 0851 (!) 129/94     Pulse Rate 02/25/18 0851 (!) 111     Resp 02/25/18 0851 16     Temp 02/25/18 0851 98.2 F (36.8 C)     Temp Source 02/25/18 0851 Oral     SpO2 02/25/18 0851 98 %     Weight 02/25/18 0852 180 lb (81.6 kg)     Height 02/25/18 0852 5' (1.524 m)     Head Circumference --      Peak Flow --      Pain Score 02/25/18 0852 7     Pain Loc --      Pain Edu? --      Excl. in GC? --     Constitutional: Alert and oriented. Well appearing and in no distress. Head: Normocephalic and atraumatic. Eyes: Conjunctivae are normal. Normal extraocular movements Cardiovascular: Normal rate, regular rhythm. Normal distal pulses and cap refill. Respiratory: Normal respiratory effort. No wheezes/rales/rhonchi. Musculoskeletal: Right hand with obvious bruising ecchymosis noted to the fourth digit at the DIP.  There is also some soft tissue swelling to the proximal phalanx.  Nontender with normal range of motion in all extremities.  Neurologic:  Normal gross sensation. Normal speech and language. No gross focal neurologic deficits are appreciated. Skin:  Skin is warm, dry and intact. No rash noted. ____________________________________________   RADIOLOGY  Right  Ring Finger  IMPRESSION: Comminuted displaced intra-articular fracture at base of distal phalanx RIGHT ring finger. ____________________________________________  PROCEDURES  Procedures IBU 800 mg PO Finger Splint ____________________________________________  INITIAL IMPRESSION / ASSESSMENT AND PLAN / ED COURSE  Patient with ED evaluation of mechanical injury to the right ring finger, resulting in pain, disability, and ecchymosis.  Patient's x-ray confirms a closed comminuted displaced  intra-articular fracture at the base of the distal phalanx.  Overlying soft tissue swelling ecchymosis also consistent with finger sprain.  Patient will be placed in appropriate finger splint, and given referral information for orthopedics.  She is also discharged with prescription for ibuprofen and Ultram to take as needed for pain and inflammation.  She should return to the ED for further evaluation or interim management as needed.  FINAL CLINICAL IMPRESSION(S) / ED DIAGNOSES  Final diagnoses:  Closed avulsion fracture of distal phalanx of finger, initial encounter      Lissa Hoard, PA-C 02/25/18 1829    Willy Eddy, MD 02/25/18 2026

## 2018-02-25 NOTE — ED Triage Notes (Signed)
Pt states she went to grab something last night and caught her right 4th finger on something and bent it back. Pt c/o pain and swelling

## 2018-02-25 NOTE — ED Notes (Signed)
Pt ambulatory to POV without difficulty. VSS. NAD. Discharge instructions, RX and follow up reviewed. All questions and concerns addressed.  

## 2018-07-25 ENCOUNTER — Emergency Department
Admission: EM | Admit: 2018-07-25 | Discharge: 2018-07-25 | Disposition: A | Discharge status: Home / Self Care | Payer: Managed Care, Other (non HMO) | Attending: Emergency Medicine | Admitting: Emergency Medicine

## 2018-07-25 ENCOUNTER — Emergency Department: Payer: Managed Care, Other (non HMO)

## 2018-07-25 ENCOUNTER — Encounter: Payer: Self-pay | Admitting: Emergency Medicine

## 2018-07-25 DIAGNOSIS — Z91010 Allergy to peanuts: Secondary | ICD-10-CM | POA: Insufficient documentation

## 2018-07-25 DIAGNOSIS — F17200 Nicotine dependence, unspecified, uncomplicated: Secondary | ICD-10-CM | POA: Diagnosis not present

## 2018-07-25 DIAGNOSIS — M545 Low back pain: Secondary | ICD-10-CM | POA: Diagnosis present

## 2018-07-25 DIAGNOSIS — M5441 Lumbago with sciatica, right side: Secondary | ICD-10-CM | POA: Insufficient documentation

## 2018-07-25 DIAGNOSIS — M542 Cervicalgia: Secondary | ICD-10-CM | POA: Diagnosis not present

## 2018-07-25 LAB — POCT PREGNANCY, URINE: Preg Test, Ur: NEGATIVE

## 2018-07-25 MED ORDER — CYCLOBENZAPRINE HCL 10 MG PO TABS
5.0000 mg | ORAL_TABLET | Freq: Once | ORAL | Status: AC
  Administered 2018-07-25: 5 mg via ORAL
  Filled 2018-07-25: qty 1

## 2018-07-25 MED ORDER — KETOROLAC TROMETHAMINE 60 MG/2ML IM SOLN
60.0000 mg | Freq: Once | INTRAMUSCULAR | Status: AC
  Administered 2018-07-25: 60 mg via INTRAMUSCULAR
  Filled 2018-07-25: qty 2

## 2018-07-25 MED ORDER — CYCLOBENZAPRINE HCL 5 MG PO TABS: 5.0000 mg | ORAL_TABLET | Freq: Three times a day (TID) | ORAL | 0 refills | Status: DC | PRN

## 2018-07-25 NOTE — ED Notes (Signed)
Report received from [place], [place]. Pt care assumed at this time.

## 2018-07-25 NOTE — ED Notes (Signed)
Patient transported to X-ray 

## 2018-07-25 NOTE — ED Notes (Signed)
MRI speaking with pt at this time to screen prior to exam.

## 2018-07-25 NOTE — ED Triage Notes (Signed)
Patient presents to the ED with back and neck pain and numbness to her right leg.  Patient reports pain with breathing and coughing.  Patient states she fell down an entire flight of stairs on Sunday and has had pain and other symptoms since that time.  Patient ambulatory to triage.  Patient placed ina c-collar.  Patient is in no obvious distress at this time.  Patient sent from [facility].

## 2018-07-25 NOTE — ED Notes (Addendum)
Pt verbalized understanding of d/c instructions, rx, and f/u care. No fruther questions at this time. Paper signature obtained due to lack of access to E- signature. Pt ambulatory to the exit with steady gait.

## 2018-07-25 NOTE — ED Notes (Signed)
Md at bedside updating pt and family on results of MRI and POC.

## 2018-07-25 NOTE — ED Notes (Signed)
Patient transported to MRI 

## 2018-07-25 NOTE — ED Notes (Signed)
Pt cleared from C-collar by MD, removed by RN at this time.

## 2018-07-25 NOTE — ED Provider Notes (Signed)
[facility] Emergency Department Provider Note  Time seen: 6:49 PM  I have reviewed the triage vital signs and the nursing notes.   HISTORY  Chief Complaint Neck Pain and Back Pain    HPI Kimberly Hansen is a 28 y.o. female with no significant past medical history presents to the emergency department 4 days after a fall down stairs.  According to the patient 4 days ago she slipped while going up stairs and fell down approximately 20 steps tumbling down the steps per patient.  Denies headache.  Denies LOC.  Patient's main concern is of pain in her neck and down her back.  Over the past 2 days she states she has felt a numbness/tingling sensation over the right quadriceps area.  Patient is ambulatory.  Placed in a c-collar upon arrival.  Patient denies any incontinence.   History reviewed. No pertinent past medical history.  There are no active problems to display for this patient.   History reviewed. No pertinent surgical history.  Prior to Admission medications   Not on File    Allergies  Allergen Reactions  . Peanut-Containing Drug Products Anaphylaxis    No family history on file.  Social History Social History   Tobacco Use  . Smoking status: Current Every Day Smoker  . Smokeless tobacco: Never Used  Substance Use Topics  . Alcohol use: Not Currently  . Drug use: Not Currently    Review of Systems Constitutional: Negative for fever. Cardiovascular: Negative for chest pain. Respiratory: Negative for shortness of breath. Gastrointestinal: Negative for abdominal pain Genitourinary: Negative for urinary compaints.  Negative for incontinence. Musculoskeletal: Positive for neck and back pain. Skin: Negative for skin complaints  Neurological: Negative for headache All other ROS negative  ____________________________________________   PHYSICAL EXAM:  VITAL SIGNS: ED Triage Vitals  Enc Vitals Group     BP 07/25/18 1655 138/80    Pulse Rate 07/25/18 1655 92     Resp 07/25/18 1655 20     Temp 07/25/18 1655 98.2 F (36.8 C)     Temp Source 07/25/18 1655 Oral     SpO2 07/25/18 1655 100 %     Weight 07/25/18 1656 187 lb (84.8 kg)     Height 07/25/18 1656 5' (1.524 m)     Head Circumference --      Peak Flow --      Pain Score 07/25/18 1655 9     Pain Loc --      Pain Edu? --      Excl. in GC? --    Constitutional: Alert and oriented. Well appearing and in no distress. Eyes: Normal exam ENT   Head: Normocephalic and atraumatic   Mouth/Throat: Mucous membranes are moist. Cardiovascular: Normal rate, regular rhythm. Respiratory: Normal respiratory effort without tachypnea nor retractions. Breath sounds are clear  Gastrointestinal: Soft and nontender. No distention. Musculoskeletal: Moderate midline C-spine tenderness, c-collar in place.  Mild midline T-spine tenderness with moderate paraspinal tenderness.  Moderate lumbar paraspinal tenderness with mild midline tenderness.  Patient states subjective numbness/tingling of her right quadriceps area, otherwise neurologically intact distally.  Great strength in both legs including quadriceps. Neurologic:  Normal speech and language. No gross focal neurologic deficits are appreciated. Skin:  Skin is warm, dry and intact.  Psychiatric: Mood and affect are normal. Speech and behavior are normal.   ____________________________________________   RADIOLOGY  MRI is negative. T-spine negative C spine negative  ____________________________________________   INITIAL IMPRESSION / ASSESSMENT AND PLAN /  ED COURSE  Pertinent labs & imaging results that were available during my care of the patient were reviewed by me and considered in my medical decision making (see chart for details).  Patient presents emergency department 4 days after a fall downstairs with complaints of moderate aching back pain worse with movement.  Numbness/tingling over the right quadriceps  area of the right leg.  Differential would include musculoskeletal pain, contusions, cervical strain, fracture, spinal contusion or injury especially the lumbar spine given patient's complaint.  We will obtain CT imaging of the C-spine, T-spine x-ray and MRI of the lumbar spine.  Patient agreeable to plan of care.  We will dose IM Toradol while awaiting results.  Imaging is negative for acute abnormality.  Overall patient continues to appear very well.  We will discharge on Flexeril and over-the-counter medications.  Patient agreeable to plan of care. ____________________________________________   FINAL CLINICAL IMPRESSION(S) / ED DIAGNOSES  Back pain Fall Neck pain   Minna Antis, MD 07/25/18 2220

## 2019-02-28 ENCOUNTER — Other Ambulatory Visit: Payer: Self-pay

## 2019-02-28 DIAGNOSIS — Z20822 Contact with and (suspected) exposure to covid-19: Secondary | ICD-10-CM

## 2019-03-01 LAB — NOVEL CORONAVIRUS, NAA: SARS-CoV-2, NAA: NOT DETECTED

## 2019-11-29 DIAGNOSIS — F17200 Nicotine dependence, unspecified, uncomplicated: Secondary | ICD-10-CM | POA: Insufficient documentation

## 2019-11-29 DIAGNOSIS — M25561 Pain in right knee: Secondary | ICD-10-CM | POA: Insufficient documentation

## 2019-11-29 DIAGNOSIS — M25551 Pain in right hip: Secondary | ICD-10-CM | POA: Insufficient documentation

## 2019-11-30 ENCOUNTER — Emergency Department: Payer: Self-pay

## 2019-11-30 ENCOUNTER — Encounter: Payer: Self-pay | Admitting: Emergency Medicine

## 2019-11-30 ENCOUNTER — Telehealth: Payer: Self-pay | Admitting: Emergency Medicine

## 2019-11-30 ENCOUNTER — Emergency Department
Admission: EM | Admit: 2019-11-30 | Discharge: 2019-11-30 | Disposition: A | Discharge status: Home / Self Care | Payer: Self-pay | Attending: Student in an Organized Health Care Education/Training Program | Admitting: Student in an Organized Health Care Education/Training Program

## 2019-11-30 ENCOUNTER — Other Ambulatory Visit: Payer: Self-pay

## 2019-11-30 DIAGNOSIS — M25561 Pain in right knee: Secondary | ICD-10-CM

## 2019-11-30 MED ORDER — OXYCODONE-ACETAMINOPHEN 5-325 MG PO TABS
1.0000 | ORAL_TABLET | Freq: Once | ORAL | Status: AC
  Administered 2019-11-30: 1 via ORAL
  Filled 2019-11-30: qty 1

## 2019-11-30 MED ORDER — OXYCODONE-ACETAMINOPHEN 5-325 MG PO TABS: 1.0000 | ORAL_TABLET | ORAL | 0 refills | Status: AC | PRN

## 2019-11-30 MED ORDER — OXYCODONE-ACETAMINOPHEN 5-325 MG PO TABS: 1.0000 | ORAL_TABLET | Freq: Three times a day (TID) | ORAL | 0 refills | Status: AC | PRN

## 2019-11-30 NOTE — Telephone Encounter (Signed)
[facility] did not receive percocet rx, confirmed with pharmacy there. Re-sent

## 2019-11-30 NOTE — ED Triage Notes (Signed)
Patient states that about 16:00 today she was standing and twisted her knee and felt it crack.

## 2019-11-30 NOTE — ED Notes (Signed)
Patient reports she was going to sit down and flopped and knee twisted and then she began to have pain.

## 2019-11-30 NOTE — ED Notes (Signed)
This RN attempted 2x and EDT attempted 1x to collect repeat trop. Lab called to request collect.

## 2019-11-30 NOTE — ED Provider Notes (Signed)
[facility] Emergency Department Provider Note    First MD Initiated Contact with Patient 11/30/19 [phone]     (approximate)  I have reviewed the triage vital signs and the nursing notes.   HISTORY  Chief Complaint Knee Pain    HPI Kimberly Hansen is a 29 y.o. female presents to the ER for evaluation of acute right knee and hip pain that occurred after she twisted it feeling and hearing a popping sound this afternoon.  States that she just went to plan to turn and sit down.  Denies falling.  Has been able to walk on it since then but is having worsening pain and now feels like she cannot walk on it.  Denies any other injury.    History reviewed. No pertinent past medical history. No family history on file. History reviewed. No pertinent surgical history. There are no problems to display for this patient.     Prior to Admission medications   Medication Sig Start Date End Date Taking? Authorizing Provider  cyclobenzaprine (FLEXERIL) 5 MG tablet Take 1 tablet (5 mg total) by mouth 3 (three) times daily as needed for muscle spasms. 07/25/18   Minna Antis, MD  oxyCODONE-acetaminophen (PERCOCET) 5-325 MG tablet Take 1 tablet by mouth every 4 (four) hours as needed for severe pain. 11/30/19 11/29/20  Willy Eddy, MD    Allergies Peanut-containing drug products    Social History Social History   Tobacco Use  . Smoking status: Current Every Day Smoker  . Smokeless tobacco: Never Used  Substance Use Topics  . Alcohol use: Yes    Comment: occ  . Drug use: Not Currently    Review of Systems Patient denies headaches, rhinorrhea, blurry vision, numbness, shortness of breath, chest pain, edema, cough, abdominal pain, nausea, vomiting, diarrhea, dysuria, fevers, rashes or hallucinations unless otherwise stated above in HPI. ____________________________________________   PHYSICAL EXAM:  VITAL SIGNS: Vitals:   11/30/19 0014  BP: 135/88    Pulse: 90  Resp: 18  Temp: 98.4 F (36.9 C)  SpO2: 98%    Constitutional: Alert and oriented.  Eyes: Conjunctivae are normal.  Head: Atraumatic. Nose: No congestion/rhinnorhea. Mouth/Throat: Mucous membranes are moist.   Neck: No stridor. Painless ROM.  Cardiovascular: Normal rate, regular rhythm. Grossly normal heart sounds.  Good peripheral circulation. Respiratory: Normal respiratory effort.  No retractions. Lungs CTAB. Gastrointestinal: Soft and nontender. No distention. No abdominal bruits. No CVA tenderness. Genitourinary:  Musculoskeletal: Right knee effusion present.  Mildly tender to palpation.  Pain with range of motion.  Neurovascular intact distally.  No other lower extremity tenderness nor edema.  Neurologic:  Normal speech and language. No gross focal neurologic deficits are appreciated. No facial droop Skin:  Skin is warm, dry and intact. No rash noted. Psychiatric: Mood and affect are normal. Speech and behavior are normal.  ____________________________________________   LABS (all labs ordered are listed, but only abnormal results are displayed)  No results found for this or any previous visit (from the past 24 hour(s)). ____________________________________________ ____________________________________________  RADIOLOGY  I personally reviewed all radiographic images ordered to evaluate for the above acute complaints and reviewed radiology reports and findings.  These findings were personally discussed with the patient.  Please see medical record for radiology report.  ____________________________________________   PROCEDURES  Procedure(s) performed:  Procedures    Critical Care performed: no ____________________________________________   INITIAL IMPRESSION / ASSESSMENT AND PLAN / ED COURSE  Pertinent labs & imaging results that were available during  my care of the patient were reviewed by me and considered in my medical decision making (see chart  for details).   DDX: fracture, contusion, ligamentous injury, meniscus injury  Kimberly Hansen is a 29 y.o. who presents to the ED with symptoms as described above.  No evidence of fracture on imaging.  Do suspect ligamentous injury.  Neurovascular intact distally.  Will be placed in knee immobilizer and crutches for outpatient follow-up.     The patient was evaluated in Emergency Department today for the symptoms described in the history of present illness. He/she was evaluated in the context of the global COVID-19 pandemic, which necessitated consideration that the patient might be at risk for infection with the SARS-CoV-2 virus that causes COVID-19. Institutional protocols and algorithms that pertain to the evaluation of patients at risk for COVID-19 are in a state of rapid change based on information released by regulatory bodies including the CDC and federal and state organizations. These policies and algorithms were followed during the patient's care in the ED.  As part of my medical decision making, I reviewed the following data within the electronic MEDICAL RECORD NUMBER Nursing notes reviewed and incorporated, Labs reviewed, notes from prior ED visits and [place] Controlled Substance Database   ____________________________________________   FINAL CLINICAL IMPRESSION(S) / ED DIAGNOSES  Final diagnoses:  Acute pain of right knee      NEW MEDICATIONS STARTED DURING THIS VISIT:  New Prescriptions   OXYCODONE-ACETAMINOPHEN (PERCOCET) 5-325 MG TABLET    Take 1 tablet by mouth every 4 (four) hours as needed for severe pain.     Note:  This document was prepared using Dragon voice recognition software and may include unintentional dictation errors.    Willy Eddy, MD 11/30/19 [phone]

## 2019-11-30 NOTE — ED Notes (Signed)
Pt verbalized understanding of discharge instructions. NAD at this time. 

## 2020-04-01 ENCOUNTER — Emergency Department: Payer: Medicaid Other

## 2020-04-01 ENCOUNTER — Emergency Department
Admission: EM | Admit: 2020-04-01 | Discharge: 2020-04-01 | Disposition: A | Discharge status: Home / Self Care | Payer: Medicaid Other | Attending: Emergency Medicine | Admitting: Emergency Medicine

## 2020-04-01 ENCOUNTER — Encounter: Payer: Self-pay | Admitting: Intensive Care

## 2020-04-01 ENCOUNTER — Other Ambulatory Visit: Payer: Self-pay

## 2020-04-01 DIAGNOSIS — Z20822 Contact with and (suspected) exposure to covid-19: Secondary | ICD-10-CM | POA: Diagnosis not present

## 2020-04-01 DIAGNOSIS — B9689 Other specified bacterial agents as the cause of diseases classified elsewhere: Secondary | ICD-10-CM | POA: Diagnosis not present

## 2020-04-01 DIAGNOSIS — R1030 Lower abdominal pain, unspecified: Secondary | ICD-10-CM | POA: Diagnosis present

## 2020-04-01 DIAGNOSIS — Z91010 Allergy to peanuts: Secondary | ICD-10-CM | POA: Diagnosis not present

## 2020-04-01 DIAGNOSIS — N3000 Acute cystitis without hematuria: Secondary | ICD-10-CM | POA: Diagnosis not present

## 2020-04-01 DIAGNOSIS — R112 Nausea with vomiting, unspecified: Secondary | ICD-10-CM

## 2020-04-01 DIAGNOSIS — F17200 Nicotine dependence, unspecified, uncomplicated: Secondary | ICD-10-CM | POA: Diagnosis not present

## 2020-04-01 HISTORY — DX: Diaphragmatic hernia without obstruction or gangrene: K44.9

## 2020-04-01 LAB — POC URINE PREG, ED: Preg Test, Ur: NEGATIVE

## 2020-04-01 LAB — BASIC METABOLIC PANEL
Anion gap: 16 — ABNORMAL HIGH (ref 5–15)
BUN: 14 mg/dL (ref 6–20)
CO2: 26 mmol/L (ref 22–32)
Calcium: 9.9 mg/dL (ref 8.9–10.3)
Chloride: 93 mmol/L — ABNORMAL LOW (ref 98–111)
Creatinine, Ser: 0.87 mg/dL (ref 0.44–1.00)
GFR, Estimated: >60 mL/min (ref >60)
Glucose, Bld: 130 mg/dL — ABNORMAL HIGH (ref 70–99)
Potassium: 4.1 mmol/L (ref 3.5–5.1)
Sodium: 135 mmol/L (ref 135–145)

## 2020-04-01 LAB — CBC
HCT: 43.4 % (ref 36.0–46.0)
Hemoglobin: 14.7 g/dL (ref 12.0–15.0)
MCH: 29.2 pg (ref 26.0–34.0)
MCHC: 33.9 g/dL (ref 30.0–36.0)
MCV: 86.3 fL (ref 80.0–100.0)
Platelets: 406 10*3/uL — ABNORMAL HIGH (ref 150–400)
RBC: 5.03 MIL/uL (ref 3.87–5.11)
RDW: 13.2 % (ref 11.5–15.5)
WBC: 15.5 10*3/uL — ABNORMAL HIGH (ref 4.0–10.5)
nRBC: 0 % (ref 0.0–0.2)

## 2020-04-01 LAB — URINALYSIS, COMPLETE (UACMP) WITH MICROSCOPIC
Bilirubin Urine: NEGATIVE
Glucose, UA: NEGATIVE mg/dL
Ketones, ur: 80 mg/dL — AB
Leukocytes,Ua: NEGATIVE
Nitrite: POSITIVE — AB
Protein, ur: >=300 mg/dL — AB
Specific Gravity, Urine: 1.034 — ABNORMAL HIGH (ref 1.005–1.030)
pH: 6 (ref 5.0–8.0)

## 2020-04-01 LAB — RESPIRATORY PANEL BY RT PCR (FLU A&B, COVID)
Influenza A by PCR: NEGATIVE
Influenza B by PCR: NEGATIVE
SARS Coronavirus 2 by RT PCR: NEGATIVE

## 2020-04-01 LAB — PREGNANCY, URINE: Preg Test, Ur: NEGATIVE

## 2020-04-01 LAB — TROPONIN I (HIGH SENSITIVITY): Troponin I (High Sensitivity): 9 ng/L (ref <18)

## 2020-04-01 MED ORDER — LACTATED RINGERS IV BOLUS
1000.0000 mL | Freq: Once | INTRAVENOUS | Status: AC
  Administered 2020-04-01: 1000 mL via INTRAVENOUS

## 2020-04-01 MED ORDER — ALUM & MAG HYDROXIDE-SIMETH 200-200-20 MG/5ML PO SUSP
15.0000 mL | Freq: Once | ORAL | Status: AC
  Administered 2020-04-01: 15 mL via ORAL
  Filled 2020-04-01: qty 30

## 2020-04-01 MED ORDER — LIDOCAINE VISCOUS HCL 2 % MT SOLN
15.0000 mL | Freq: Once | OROMUCOSAL | Status: AC
  Administered 2020-04-01: 15 mL via ORAL
  Filled 2020-04-01: qty 15

## 2020-04-01 MED ORDER — PROMETHAZINE HCL 25 MG/ML IJ SOLN
12.5000 mg | Freq: Once | INTRAMUSCULAR | Status: AC
  Administered 2020-04-01: 12.5 mg via INTRAVENOUS
  Filled 2020-04-01: qty 1

## 2020-04-01 MED ORDER — CEPHALEXIN 500 MG PO CAPS: 500.0000 mg | ORAL_CAPSULE | Freq: Two times a day (BID) | ORAL | 0 refills | Status: AC

## 2020-04-01 MED ORDER — AMLODIPINE BESYLATE 5 MG PO TABS
5.0000 mg | ORAL_TABLET | Freq: Once | ORAL | Status: AC
  Administered 2020-04-01: 5 mg via ORAL
  Filled 2020-04-01: qty 1

## 2020-04-01 MED ORDER — ONDANSETRON HCL 4 MG/2ML IJ SOLN
4.0000 mg | Freq: Once | INTRAMUSCULAR | Status: AC
  Administered 2020-04-01: 4 mg via INTRAVENOUS
  Filled 2020-04-01: qty 2

## 2020-04-01 MED ORDER — IOHEXOL 300 MG/ML  SOLN
100.0000 mL | Freq: Once | INTRAMUSCULAR | Status: AC | PRN
  Administered 2020-04-01: 100 mL via INTRAVENOUS

## 2020-04-01 MED ORDER — PROMETHAZINE HCL 12.5 MG PO TABS: 12.5000 mg | ORAL_TABLET | Freq: Four times a day (QID) | ORAL | 0 refills | Status: DC | PRN

## 2020-04-01 MED ORDER — SODIUM CHLORIDE 0.9 % IV SOLN
1.0000 g | Freq: Once | INTRAVENOUS | Status: AC
  Administered 2020-04-01: 1 g via INTRAVENOUS
  Filled 2020-04-01: qty 10

## 2020-04-01 MED ORDER — KETOROLAC TROMETHAMINE 30 MG/ML IJ SOLN
15.0000 mg | Freq: Once | INTRAMUSCULAR | Status: AC
  Administered 2020-04-01: 15 mg via INTRAVENOUS
  Filled 2020-04-01: qty 1

## 2020-04-01 NOTE — ED Triage Notes (Signed)
Patient c/o chest pain, abdominal pain, and urinary retention since Sunday. Reports some dribbling of urine. Reports symptoms have progressively gotten worse

## 2020-04-01 NOTE — ED Notes (Signed)
Patient transported from CT to room. 

## 2020-04-01 NOTE — ED Provider Notes (Signed)
[facility] Emergency Department Provider Note   ____________________________________________   First MD Initiated Contact with Patient 04/01/20 1443     (approximate)  I have reviewed the triage vital signs and the nursing notes.   HISTORY  Chief Complaint Chest Pain, Abdominal Pain, and Urinary Retention    HPI Kimberly Hansen is a 29 y.o. female with no significant past medical history who presents to the ED complaining of abdominal pain and vomiting.  Patient reports she has had about 4 days of suprapubic abdominal pain that is constant and sharp, not exacerbated or alleviated by anything.  She has had nausea with frequent vomiting, describes being unable to tolerate any p.o. for the past 24 hours.  She denies any associated diarrhea but has noticed urinary frequency and hematuria.  She has not had any fevers but does report some soreness in her chest that has come on after she started vomiting.  She has not had any cough or difficulty breathing.        Past Medical History:  Diagnosis Date   Hiatal hernia     There are no problems to display for this patient.   History reviewed. No pertinent surgical history.  Prior to Admission medications   Medication Sig Start Date End Date Taking? Authorizing Provider  cephALEXin (KEFLEX) 500 MG capsule Take 1 capsule (500 mg total) by mouth 2 (two) times daily for 7 days. 04/01/20 04/08/20  Chesley Noon, MD  cyclobenzaprine (FLEXERIL) 5 MG tablet Take 1 tablet (5 mg total) by mouth 3 (three) times daily as needed for muscle spasms. 07/25/18   Minna Antis, MD  oxyCODONE-acetaminophen (PERCOCET) 5-325 MG tablet Take 1 tablet by mouth every 4 (four) hours as needed for severe pain. 11/30/19 11/29/20  Willy Eddy, MD  oxyCODONE-acetaminophen (PERCOCET) 5-325 MG tablet Take 1 tablet by mouth every 8 (eight) hours as needed for severe pain. 11/30/19 11/29/20  Jene Every, MD  promethazine  (PHENERGAN) 12.5 MG tablet Take 1 tablet (12.5 mg total) by mouth every 6 (six) hours as needed for nausea or vomiting. 04/01/20   Chesley Noon, MD    Allergies Peanut-containing drug products  History reviewed. No pertinent family history.  Social History Social History   Tobacco Use   Smoking status: Current Every Day Smoker   Smokeless tobacco: Never Used  Building services engineer Use: Every day  Substance Use Topics   Alcohol use: Yes    Comment: occ   Drug use: Not Currently    Review of Systems  Constitutional: No fever/chills Eyes: No visual changes. ENT: No sore throat. Cardiovascular: Denies chest pain. Respiratory: Denies shortness of breath. Gastrointestinal: Positive for abdominal pain, nausea, and vomiting.  No diarrhea.  No constipation. Genitourinary: Negative for dysuria.  Positive for urinary frequency and hematuria.  Negative for vaginal bleeding or discharge. Musculoskeletal: Negative for back pain. Skin: Negative for rash. Neurological: Negative for headaches, focal weakness or numbness.  ____________________________________________   PHYSICAL EXAM:  VITAL SIGNS: ED Triage Vitals  Enc Vitals Group     BP 04/01/20 1427 (!) 183/116     Pulse Rate 04/01/20 1427 (!) 140     Resp 04/01/20 1427 16     Temp 04/01/20 1427 98.7 F (37.1 C)     Temp Source 04/01/20 1427 Oral     SpO2 04/01/20 1427 98 %     Weight 04/01/20 1430 175 lb (79.4 kg)     Height 04/01/20 1430 5' (1.524 m)  Head Circumference --      Peak Flow --      Pain Score 04/01/20 1430 10     Pain Loc --      Pain Edu? --      Excl. in GC? --     Constitutional: Alert and oriented. Eyes: Conjunctivae are normal. Head: Atraumatic. Nose: No congestion/rhinnorhea. Mouth/Throat: Mucous membranes are moist. Neck: Normal ROM Cardiovascular: Tachycardic, regular rhythm. Grossly normal heart sounds. Respiratory: Normal respiratory effort.  No retractions. Lungs  CTAB. Gastrointestinal: Soft and tender to palpation in the suprapubic area with no rebound or guarding. No distention. Genitourinary: deferred Musculoskeletal: No lower extremity tenderness nor edema. Neurologic:  Normal speech and language. No gross focal neurologic deficits are appreciated. Skin:  Skin is warm, dry and intact. No rash noted. Psychiatric: Mood and affect are normal. Speech and behavior are normal.  ____________________________________________   LABS (all labs ordered are listed, but only abnormal results are displayed)  Labs Reviewed  BASIC METABOLIC PANEL - Abnormal; Notable for the following components:      Result Value   Chloride 93 (*)    Glucose, Bld 130 (*)    Anion gap 16 (*)    All other components within normal limits  CBC - Abnormal; Notable for the following components:   WBC 15.5 (*)    Platelets 406 (*)    All other components within normal limits  URINALYSIS, COMPLETE (UACMP) WITH MICROSCOPIC - Abnormal; Notable for the following components:   Color, Urine AMBER (*)    APPearance CLOUDY (*)    Specific Gravity, Urine 1.034 (*)    Hgb urine dipstick MODERATE (*)    Ketones, ur 80 (*)    Protein, ur >=300 (*)    Nitrite POSITIVE (*)    Bacteria, UA RARE (*)    All other components within normal limits  RESPIRATORY PANEL BY RT PCR (FLU A&B, COVID)  URINE CULTURE  PREGNANCY, URINE  POC URINE PREG, ED  TROPONIN I (HIGH SENSITIVITY)   ____________________________________________  EKG  ED ECG REPORT I, Chesley Noon, the attending physician, personally viewed and interpreted this ECG.   Date: 04/01/2020  EKG Time: 14:23  Rate: 144  Rhythm: sinus tachycardia  Axis: Normal  Intervals:none  ST&T Change: Nonspecific ST changes   PROCEDURES  Procedure(s) performed (including Critical Care):  Procedures   ____________________________________________   INITIAL IMPRESSION / ASSESSMENT AND PLAN / ED COURSE       29 year old  female with no significant past medical history presents to the ED with 4 days of suprapubic abdominal pain along with frequent nausea and vomiting, also endorses hematuria and urinary frequency.  She was concern for urinary retention but bladder scan shows less than 100 cc of urine.  I suspect her urinary symptoms are secondary to UTI, we will check UA and pregnancy test.  She does appear dehydrated as well with dry mucous membranes and tachycardia, we will treat with IV fluids and Zofran.  If UA does not explain patient's abdominal pain, we will proceed with CT scan, but appendicitis or other intra-abdominal pathology seems unlikely at this time.  Chest x-ray reviewed by me and shows no infiltrate, edema, or effusion.  Chest pain most likely related to her frequent vomiting as EKG is unremarkable and troponin negative.  UA consistent with UTI, pregnancy test is negative.  Patient was given Zofran, Toradol, IV fluids, and initial dose of antibiotics.  Unfortunately, she continued to feel nauseous and had additional vomiting following  Zofran.  CT scan was performed and negative for acute process.  Patient now able to tolerate p.o. following dose of Phenergan and is appropriate for discharge home with course of antibiotics.  We will start her on Keflex, urine also sent for culture.  She was counseled to follow-up with her PCP and return to the ED for new or worsening symptoms.  Patient noted to be hypertensive throughout her stay with no history of hypertension.  She was counseled to follow-up with her PCP for recheck of her blood pressure.      ____________________________________________   FINAL CLINICAL IMPRESSION(S) / ED DIAGNOSES  Final diagnoses:  Acute cystitis without hematuria  Non-intractable vomiting with nausea, unspecified vomiting type     ED Discharge Orders         Ordered    promethazine (PHENERGAN) 12.5 MG tablet  Every 6 hours PRN        04/01/20 2002    cephALEXin (KEFLEX)  500 MG capsule  2 times daily        04/01/20 2002           Note:  This document was prepared using Dragon voice recognition software and may include unintentional dictation errors.   Chesley Noon, MD 04/01/20 2007

## 2020-04-02 LAB — URINE CULTURE

## 2020-04-09 ENCOUNTER — Other Ambulatory Visit: Payer: Self-pay

## 2020-04-09 ENCOUNTER — Encounter: Payer: Self-pay | Admitting: Emergency Medicine

## 2020-04-09 ENCOUNTER — Emergency Department
Admission: EM | Admit: 2020-04-09 | Discharge: 2020-04-09 | Disposition: A | Discharge status: Home / Self Care | Payer: Medicaid Other | Attending: Emergency Medicine | Admitting: Emergency Medicine

## 2020-04-09 DIAGNOSIS — Z8719 Personal history of other diseases of the digestive system: Secondary | ICD-10-CM | POA: Insufficient documentation

## 2020-04-09 DIAGNOSIS — R112 Nausea with vomiting, unspecified: Secondary | ICD-10-CM | POA: Diagnosis not present

## 2020-04-09 DIAGNOSIS — R1013 Epigastric pain: Secondary | ICD-10-CM | POA: Diagnosis not present

## 2020-04-09 DIAGNOSIS — F17200 Nicotine dependence, unspecified, uncomplicated: Secondary | ICD-10-CM | POA: Diagnosis not present

## 2020-04-09 DIAGNOSIS — Z91010 Allergy to peanuts: Secondary | ICD-10-CM | POA: Diagnosis not present

## 2020-04-09 DIAGNOSIS — R1030 Lower abdominal pain, unspecified: Secondary | ICD-10-CM | POA: Diagnosis present

## 2020-04-09 LAB — CBC
HCT: 35.1 % — ABNORMAL LOW (ref 36.0–46.0)
Hemoglobin: 11.7 g/dL — ABNORMAL LOW (ref 12.0–15.0)
MCH: 29.3 pg (ref 26.0–34.0)
MCHC: 33.3 g/dL (ref 30.0–36.0)
MCV: 87.8 fL (ref 80.0–100.0)
Platelets: 482 10*3/uL — ABNORMAL HIGH (ref 150–400)
RBC: 4 MIL/uL (ref 3.87–5.11)
RDW: 13.4 % (ref 11.5–15.5)
WBC: 10.3 10*3/uL (ref 4.0–10.5)
nRBC: 0 % (ref 0.0–0.2)

## 2020-04-09 LAB — COMPREHENSIVE METABOLIC PANEL
ALT: 32 U/L (ref 0–44)
AST: 18 U/L (ref 15–41)
Albumin: 4.9 g/dL (ref 3.5–5.0)
Alkaline Phosphatase: 55 U/L (ref 38–126)
Anion gap: 12 (ref 5–15)
BUN: 6 mg/dL (ref 6–20)
CO2: 24 mmol/L (ref 22–32)
Calcium: 8.7 mg/dL — ABNORMAL LOW (ref 8.9–10.3)
Chloride: 106 mmol/L (ref 98–111)
Creatinine, Ser: 0.59 mg/dL (ref 0.44–1.00)
GFR, Estimated: >60 mL/min (ref >60)
Glucose, Bld: 131 mg/dL — ABNORMAL HIGH (ref 70–99)
Potassium: 3.6 mmol/L (ref 3.5–5.1)
Sodium: 142 mmol/L (ref 135–145)
Total Bilirubin: 0.3 mg/dL (ref 0.3–1.2)
Total Protein: 8.4 g/dL — ABNORMAL HIGH (ref 6.5–8.1)

## 2020-04-09 LAB — LIPASE, BLOOD: Lipase: 20 U/L (ref 11–51)

## 2020-04-09 MED ORDER — ONDANSETRON 4 MG PO TBDP: 4.0000 mg | ORAL_TABLET | Freq: Three times a day (TID) | ORAL | 0 refills | Status: DC | PRN

## 2020-04-09 MED ORDER — PANTOPRAZOLE SODIUM 20 MG PO TBEC: 20.0000 mg | DELAYED_RELEASE_TABLET | Freq: Every day | ORAL | 1 refills | Status: DC

## 2020-04-09 MED ORDER — SODIUM CHLORIDE 0.9 % IV SOLN
1000.0000 mL | Freq: Once | INTRAVENOUS | Status: AC
  Administered 2020-04-09: 1000 mL via INTRAVENOUS

## 2020-04-09 MED ORDER — ONDANSETRON HCL 4 MG/2ML IJ SOLN
4.0000 mg | Freq: Once | INTRAMUSCULAR | Status: AC
  Administered 2020-04-09: 4 mg via INTRAVENOUS
  Filled 2020-04-09: qty 2

## 2020-04-09 MED ORDER — SUCRALFATE 1 G PO TABS: 1.0000 g | ORAL_TABLET | Freq: Four times a day (QID) | ORAL | 0 refills | Status: DC

## 2020-04-09 MED ORDER — MORPHINE SULFATE (PF) 4 MG/ML IV SOLN
4.0000 mg | Freq: Once | INTRAVENOUS | Status: AC
  Administered 2020-04-09: 4 mg via INTRAVENOUS
  Filled 2020-04-09: qty 1

## 2020-04-09 NOTE — ED Triage Notes (Signed)
Pt presents to ED via [facility] with c/o RUQ abdominal pain that started last night. Pt states several episodes of emesis, denies diarrhea.   Pt responds to all questions, noted to be sitting in wheelchair with eyes closed during triage.

## 2020-04-09 NOTE — ED Notes (Signed)
Pt states pain now 2/10.

## 2020-04-09 NOTE — ED Notes (Signed)
First Nurse Note: Pt to ED via EMS for nausea and abdominal pain. Pt recently had a blood infection.  BP:166/104 SpO2:100% HR:88 CBG:144

## 2020-04-09 NOTE — ED Provider Notes (Signed)
[facility] Emergency Department Provider Note   ____________________________________________    I have reviewed the triage vital signs and the nursing notes.   HISTORY  Chief Complaint Abdominal Pain     HPI Kimberly Hansen is a 29 y.o. female who presents with complaints of abdominal pain and nausea and vomiting.  Patient describes lower abdominal cramping pain with frequent nausea and vomiting.  She reports she was seen here for this 1 week ago as well, had some improvement but symptoms have returned.  No history of abdominal surgery.  Reviewed medical records from last visit on the fourth, had CT scan which was unremarkable.  Treated for urinary tract infection.  Reports she completed antibiotics.  She does admit to smoking marijuana frequently.  Past Medical History:  Diagnosis Date  . Hiatal hernia     There are no problems to display for this patient.   History reviewed. No pertinent surgical history.  Prior to Admission medications   Medication Sig Start Date End Date Taking? Authorizing Provider  cyclobenzaprine (FLEXERIL) 5 MG tablet Take 1 tablet (5 mg total) by mouth 3 (three) times daily as needed for muscle spasms. 07/25/18   Minna Antis, MD  ondansetron (ZOFRAN ODT) 4 MG disintegrating tablet Take 1 tablet (4 mg total) by mouth every 8 (eight) hours as needed. 04/09/20   Jene Every, MD  oxyCODONE-acetaminophen (PERCOCET) 5-325 MG tablet Take 1 tablet by mouth every 4 (four) hours as needed for severe pain. 11/30/19 11/29/20  Willy Eddy, MD  oxyCODONE-acetaminophen (PERCOCET) 5-325 MG tablet Take 1 tablet by mouth every 8 (eight) hours as needed for severe pain. 11/30/19 11/29/20  Jene Every, MD  pantoprazole (PROTONIX) 20 MG tablet Take 1 tablet (20 mg total) by mouth daily. 04/09/20 04/09/21  Jene Every, MD  promethazine (PHENERGAN) 12.5 MG tablet Take 1 tablet (12.5 mg total) by mouth every 6 (six) hours as  needed for nausea or vomiting. 04/01/20   Chesley Noon, MD  sucralfate (CARAFATE) 1 g tablet Take 1 tablet (1 g total) by mouth 4 (four) times daily for 15 days. 04/09/20 04/24/20  Jene Every, MD     Allergies Peanut-containing drug products  No family history on file.  Social History Social History   Tobacco Use  . Smoking status: Current Every Day Smoker  . Smokeless tobacco: Never Used  Vaping Use  . Vaping Use: Every day  Substance Use Topics  . Alcohol use: Yes    Comment: occ  . Drug use: Not Currently    Review of Systems  Constitutional: No fever/chills Eyes: No visual changes.  ENT: No sore throat. Cardiovascular: Denies chest pain. Respiratory: Denies shortness of breath. Gastrointestinal: As above Genitourinary: Negative for dysuria. Musculoskeletal: Negative for back pain. Skin: Negative for rash. Neurological: Negative for headaches or weakness   ____________________________________________   PHYSICAL EXAM:  VITAL SIGNS: ED Triage Vitals [04/09/20 1029]  Enc Vitals Group     BP (!) 156/108     Pulse Rate 94     Resp 18     Temp 98.4 F (36.9 C)     Temp Source Oral     SpO2 100 %     Weight 76.2 kg (168 lb)     Height 1.6 m (5\' 3" )     Head Circumference      Peak Flow      Pain Score 7     Pain Loc      Pain Edu?  Excl. in GC?     Constitutional: Alert and oriented.   Nose: No congestion/rhinnorhea. Mouth/Throat: Mucous membranes are moist.   Neck:  Painless ROM Cardiovascular: Normal rate, regular rhythm. Grossly normal heart sounds.  Good peripheral circulation. Respiratory: Normal respiratory effort.  No retractions. Lungs CTAB. Gastrointestinal: Soft, nontender, no CVA tenderness  Musculoskeletal: No lower extremity tenderness nor edema.  Warm and well perfused Neurologic:  Normal speech and language. No gross focal neurologic deficits are appreciated.  Skin:  Skin is warm, dry and intact. No rash  noted. Psychiatric: Mood and affect are normal. Speech and behavior are normal.  ____________________________________________   LABS (all labs ordered are listed, but only abnormal results are displayed)  Labs Reviewed  COMPREHENSIVE METABOLIC PANEL - Abnormal; Notable for the following components:      Result Value   Glucose, Bld 131 (*)    Calcium 8.7 (*)    Total Protein 8.4 (*)    All other components within normal limits  CBC - Abnormal; Notable for the following components:   Hemoglobin 11.7 (*)    HCT 35.1 (*)    Platelets 482 (*)    All other components within normal limits  LIPASE, BLOOD  URINALYSIS, COMPLETE (UACMP) WITH MICROSCOPIC  POC URINE PREG, ED   ____________________________________________  EKG  None ____________________________________________  RADIOLOGY   ____________________________________________   PROCEDURES  Procedure(s) performed: No  Procedures   Critical Care performed: No ____________________________________________   INITIAL IMPRESSION / ASSESSMENT AND PLAN / ED COURSE  Pertinent labs & imaging results that were available during my care of the patient were reviewed by me and considered in my medical decision making (see chart for details).  Patient presents with nausea vomiting abdominal cramping.  Suspicious for gastritis, cyclical vomiting, possible marijuana hyperemesis.  Reviewed records from last ED visit on the fourth, reassuring work-up at that time, normal CT scan.  No significant tenderness to palpation necessitating imaging at this time.  Will treat with IV fluids, IV Zofran, IV morphine and reevaluate.    Lab work today is quite reassuring.  Patient felt much better after treatment, nausea and vomiting has resolved, pain has improved.  Question possible gastritis given history of hiatal hernia.  We will start her on Protonix, Carafate, Zofran, outpatient referral to GI, return precautions discussed.     ____________________________________________   FINAL CLINICAL IMPRESSION(S) / ED DIAGNOSES  Final diagnoses:  Epigastric pain        Note:  This document was prepared using Dragon voice recognition software and may include unintentional dictation errors.   Jene Every, MD 04/09/20 [phone]

## 2020-04-09 NOTE — ED Notes (Signed)
Patient states her nausea has improved

## 2020-04-09 NOTE — ED Notes (Signed)
Pt given urine specimen cup and educated sample needed when possible. Call bell within reach.

## 2021-01-12 ENCOUNTER — Ambulatory Visit (LOCAL_COMMUNITY_HEALTH_CENTER): Payer: Medicaid Other | Admitting: Physician Assistant

## 2021-01-12 ENCOUNTER — Encounter: Payer: Self-pay | Admitting: Physician Assistant

## 2021-01-12 ENCOUNTER — Other Ambulatory Visit: Payer: Self-pay

## 2021-01-12 VITALS — BP 124/83 | Ht 61.5 in | Wt 156.0 lb

## 2021-01-12 DIAGNOSIS — Z01419 Encounter for gynecological examination (general) (routine) without abnormal findings: Secondary | ICD-10-CM

## 2021-01-12 DIAGNOSIS — Z3046 Encounter for surveillance of implantable subdermal contraceptive: Secondary | ICD-10-CM

## 2021-01-12 DIAGNOSIS — Z113 Encounter for screening for infections with a predominantly sexual mode of transmission: Secondary | ICD-10-CM

## 2021-01-12 DIAGNOSIS — Z3009 Encounter for other general counseling and advice on contraception: Secondary | ICD-10-CM | POA: Diagnosis not present

## 2021-01-12 DIAGNOSIS — Z124 Encounter for screening for malignant neoplasm of cervix: Secondary | ICD-10-CM

## 2021-01-12 DIAGNOSIS — B9689 Other specified bacterial agents as the cause of diseases classified elsewhere: Secondary | ICD-10-CM

## 2021-01-12 LAB — WET PREP FOR TRICH, YEAST, CLUE
Trichomonas Exam: NEGATIVE
Yeast Exam: NEGATIVE

## 2021-01-12 LAB — HM HIV SCREENING LAB: HM HIV Screening: NEGATIVE

## 2021-01-12 LAB — HM HEPATITIS C SCREENING LAB: HM Hepatitis Screen: NEGATIVE

## 2021-01-12 MED ORDER — METRONIDAZOLE 500 MG PO TABS: 500.0000 mg | ORAL_TABLET | Freq: Two times a day (BID) | ORAL | 0 refills | Status: AC

## 2021-01-12 NOTE — Progress Notes (Signed)
Wet mount reviewed by provider, pt treated for BV per provider orders. Pt accepted latex free condoms, [employer] card, and LCSW business card. Pt to check her information/look for card about her last nexplanon insertion and call [facility].

## 2021-01-12 NOTE — Progress Notes (Signed)
Pt here for physical, STD testing, discuss how to prevent BV, discuss different BC options. Pt c/o BV symptoms, extra discharge and smells funny. Also complains of some urinary issue. Pt currently has nexplanon that was placed by [facility] but is unsure of when it was placed. ROI signed for [facility] nexplanon and pap records; faxed and confirmation received; Per [facility], no records for these services.

## 2021-01-14 ENCOUNTER — Encounter: Payer: Self-pay | Admitting: Physician Assistant

## 2021-01-14 ENCOUNTER — Telehealth: Payer: Self-pay

## 2021-01-14 NOTE — Progress Notes (Signed)
[address] [facility] DEPARTMENT [facility] [address] Main Number: [phone]    Family Planning Visit- Initial Visit  Subjective:  Kimberly Hansen is a 30 y.o.  G2P1102   being seen today for an initial annual visit and to discuss contraceptive options.  The patient is currently using Nexplanon for pregnancy prevention. Patient reports she does not want a pregnancy in the next year.  Patient has the following medical conditions does not have a problem list on file.  Chief Complaint  Patient presents with   Contraception    Initial annual exam and Discuss BC options    Patient reports that she is here for a physical and to discuss birth control options.  States that she is not sure when her Nexplanon was placed but that it was done at [employer] [facility].  Reports that she has had an increased amount of vaginal discharge with odor for 2 months and would like screening for infections.  Also, reports occasional "boils" in the genital area.  States that she has "bad" headaches/migraines with which she has nausea and photophobia, the pain starts in the temple area, moves to back of the head and neck area then her whole head hurts.  States that she takes Aspirin and stays in a dark, quiet room.  Reports history of anxiety and depression but states that she has good and bad days.  PHQ-9=6 today.  Declines referral to LCSW, but will accept LCSW and [employer] cards.  Patient also concerned about having BV a lot.  Patient denies other concerns.    Body mass index is 29 kg/m. - Patient is eligible for diabetes screening based on BMI and age >69?  not applicable HA1C ordered? not applicable  Patient reports 1  partner/s in last year. Desires STI screening?  Yes  Has patient been screened once for HCV in the past?  No  No results found for: HCVAB  Does the patient have current drug use (including MJ), have a partner with drug use, and/or has been incarcerated since  last result? No  If yes-- Screen for HCV through [facility]   Does the patient meet criteria for HBV testing? No  Criteria:  -Household, sexual or needle sharing contact with HBV -History of drug use -HIV positive -Those with known Hep C   Health Maintenance Due  Topic Date Due   COVID-19 Vaccine (1) Never done   Pneumococcal Vaccine 12-66 Years old (1 - PCV) Never done   HIV Screening  Never done   Hepatitis C Screening  Never done   TETANUS/TDAP  Never done   PAP-Cervical Cytology Screening  Never done   PAP SMEAR-Modifier  Never done   INFLUENZA VACCINE  12/27/2020    Review of Systems  All other systems reviewed and are negative.  The following portions of the patient's history were reviewed and updated as appropriate: allergies, current medications, past family history, past medical history, past social history, past surgical history and problem list. Problem list updated.   See flowsheet for other program required questions.  Objective:   Vitals:   01/12/21 1455  BP: 124/83  Weight: 156 lb (70.8 kg)  Height: 5' 1.5" (1.562 m)    Physical Exam Vitals and nursing note reviewed.  Constitutional:      General: She is not in acute distress.    Appearance: Normal appearance.  HENT:     Head: Normocephalic and atraumatic.     Mouth/Throat:  Mouth: Mucous membranes are moist.     Pharynx: Oropharynx is clear. No oropharyngeal exudate or posterior oropharyngeal erythema.  Eyes:     Conjunctiva/sclera: Conjunctivae normal.  Neck:     Thyroid: No thyroid mass, thyromegaly or thyroid tenderness.  Cardiovascular:     Rate and Rhythm: Normal rate and regular rhythm.  Pulmonary:     Effort: Pulmonary effort is normal.     Breath sounds: Normal breath sounds.  Chest:  Breasts:    Right: Normal. No mass, nipple discharge, skin change or tenderness.     Left: Normal. No mass, nipple discharge, skin change or tenderness.  Abdominal:     Palpations: Abdomen is  soft. There is no mass.     Tenderness: There is no abdominal tenderness. There is no guarding or rebound.  Genitourinary:    General: Normal vulva.     Rectum: Normal.     Comments: External genitalia/pubic area without nits, lice, edema, erythema, lesions and inguinal adenopathy. Vagina with normal mucosa and discharge. Cervix without visible lesions. Uterus firm, mobile, nt, no masses, no CMT, no adnexal tenderness or fullness.  Musculoskeletal:     Cervical back: Neck supple. No tenderness.  Lymphadenopathy:     Cervical: No cervical adenopathy.     Upper Body:     Right upper body: No supraclavicular, axillary or pectoral adenopathy.     Left upper body: No supraclavicular, axillary or pectoral adenopathy.  Skin:    General: Skin is warm and dry.     Findings: No bruising, erythema, lesion or rash.  Neurological:     Mental Status: She is alert and oriented to person, place, and time.  Psychiatric:        Mood and Affect: Mood normal.        Behavior: Behavior normal.        Thought Content: Thought content normal.        Judgment: Judgment normal.      Assessment and Plan:  Kimberly Hansen is a 30 y.o. female presenting to the [facility] Department for an initial annual wellness/contraceptive visit  Contraception counseling: Reviewed all forms of birth control options in the tiered based approach. available including abstinence; over the counter/barrier methods; hormonal contraceptive medication including pill, patch, ring, injection,contraceptive implant, ECP; hormonal and nonhormonal IUDs; permanent sterilization options including vasectomy and the various tubal sterilization modalities. Risks, benefits, and typical effectiveness rates were reviewed.  Questions were answered.  Written information was also given to the patient to review.  Patient desires to have Nexplanon removal but is not sure about what to use after removal. She will follow up in  once she  finds her card for Nexplanon to see when her removal is due.  She was told to call with any further questions, or with any concerns about this method of contraception.  Emphasized use of condoms 100% of the time for STI prevention.  Patient was not a candidate for ECP today.  1. Encounter for counseling regarding contraception Reviewed with patient as above re: BCM options. Reviewed with patient normal SE of Nexplanon and when to call clinic with concerns. Enc condoms with all sex for STD protection, especially since unsure how long Nexplanon has been in place.  2. Screening for STD (sexually transmitted disease) Await test results.  Counseled patient that RN will call once results are back if needs to RTC for treatment.  - WET PREP FOR TRICH, YEAST, CLUE - Chlamydia/Gonorrhea  Lab -  HIV/HCV [facility] - Syphilis Serology, [facility]  3. Well woman exam with routine gynecological exam Reviewed with patient healthy habits to maintain general health. Enc MVI 1 po daily. Enc to establish with/ follow up with PCP for primary care concerns, age appropriate screenings and illness.   4. Surveillance of previously prescribed implantable subdermal contraceptive Await patient call or bringing records re: when Nexplanon was placed. While patient here, ROI sent to [facility], and they responded that patient was not seen there.  5. Papanicolaou smear Await pap results.  Counseled patient that RN will call or send letter once results are back with follow up plan/recommendations.  - IGP, rfx Aptima HPV ASCU  6. BV (bacterial vaginosis) Reviewed with patient steps to prevent BV and yeast: Wear all-cotton underwear Sleep without underwear Take showers instead of baths Wear loose fitting clothing, especially during warm/hot weather Use a hair dryer on low after bathing to dry the area Avoid scented soaps and body washes Do not douche May try over the counter probiotics or boric acid gel or  suppositories Stop smoking  Treat BV with Metronidazole 500 mg #14 1 po BID for 7 days with food, no EtOH for 24 hr before and until 72 hr after completing medicine. No sex for 10 days. Enc to use OTC antifungal cream if has itching during or just after antibiotic use.  - metroNIDAZOLE (FLAGYL) 500 MG tablet; Take 1 tablet (500 mg total) by mouth 2 (two) times daily for 7 days.  Dispense: 14 tablet; Refill: 0     No follow-ups on file.  No future appointments.  Matt Holmes, PA

## 2021-01-14 NOTE — Telephone Encounter (Signed)
Phone call to pt. Left message that RN with [facility] is calling to schedule appt to address removal of old nexplanon (placed 03/08/2016 by [facility] - [place], [place] reviewed records), and to discuss her desired BC method once old nexplanon removed. Recommend no sex until new birth control is working, but if sexually active at least use a back up method like condoms with all sex.  Please call us back at [phone] to discuss and schedule.   (Need to find out last unprotected sex; schedule appt at least 2 weeks out from that date per C.[place], [place])

## 2021-01-14 NOTE — Progress Notes (Signed)
Patient brought copy of records from [facility] by on 01/13/2021.  Reviewed records today, 01/14/2021 and patient had a Nexplanon removal/reinsertion on 03/08/2016.  Asked RN to call patient and schedule her for Nexplanon removal/reinsertion in 2 weeks, if patient wants reinsertion.

## 2021-01-17 NOTE — Telephone Encounter (Signed)
Phone call to pt.   Discussed date that her last nexplanon was placed. Pt states the last time she had sex was yesterday, 01/16/21, without a condom.  Pt scheduled for 02/01/21 nexplanon removal and reinsertion appt., and pt counseled it is best to abstain from sex until after the new nexplanon is working but at least use condoms or another back up method with all sex until then. Pt expressed understanding.

## 2021-01-21 LAB — IGP, RFX APTIMA HPV ASCU: PAP Smear Comment: 0

## 2021-01-21 LAB — HPV APTIMA: HPV Aptima: POSITIVE — AB

## 2021-02-01 ENCOUNTER — Ambulatory Visit: Payer: Medicaid Other

## 2021-02-04 ENCOUNTER — Ambulatory Visit (LOCAL_COMMUNITY_HEALTH_CENTER): Payer: Medicaid Other | Admitting: Advanced Practice Midwife

## 2021-02-04 ENCOUNTER — Other Ambulatory Visit: Payer: Self-pay

## 2021-02-04 ENCOUNTER — Ambulatory Visit: Payer: Medicaid Other

## 2021-02-04 ENCOUNTER — Encounter: Payer: Self-pay | Admitting: Advanced Practice Midwife

## 2021-02-04 VITALS — BP 133/90 | Ht 61.5 in | Wt 159.0 lb

## 2021-02-04 DIAGNOSIS — Z3202 Encounter for pregnancy test, result negative: Secondary | ICD-10-CM | POA: Diagnosis not present

## 2021-02-04 DIAGNOSIS — R87619 Unspecified abnormal cytological findings in specimens from cervix uteri: Secondary | ICD-10-CM | POA: Insufficient documentation

## 2021-02-04 DIAGNOSIS — Z30017 Encounter for initial prescription of implantable subdermal contraceptive: Secondary | ICD-10-CM | POA: Diagnosis not present

## 2021-02-04 DIAGNOSIS — Z3009 Encounter for other general counseling and advice on contraception: Secondary | ICD-10-CM | POA: Diagnosis not present

## 2021-02-04 DIAGNOSIS — R87610 Atypical squamous cells of undetermined significance on cytologic smear of cervix (ASC-US): Secondary | ICD-10-CM

## 2021-02-04 DIAGNOSIS — E663 Overweight: Secondary | ICD-10-CM

## 2021-02-04 DIAGNOSIS — Z3046 Encounter for surveillance of implantable subdermal contraceptive: Secondary | ICD-10-CM

## 2021-02-04 LAB — PREGNANCY, URINE: Preg Test, Ur: NEGATIVE

## 2021-02-04 MED ORDER — ETONOGESTREL 68 MG ~~LOC~~ IMPL
68.0000 mg | DRUG_IMPLANT | Freq: Once | SUBCUTANEOUS | Status: AC
  Administered 2021-02-04: 68 mg via SUBCUTANEOUS

## 2021-02-04 NOTE — Progress Notes (Signed)
Contraception/Family Planning VISIT ENCOUNTER NOTE  Subjective:   Kimberly Hansen is a 30 y.o. G40P1102 female here for reproductive life counseling.  Desires Nexplanon removal/reinsertion for BCM.  Reports she does not want a pregnancy in the next year. Denies abnormal vaginal bleeding, discharge, pelvic pain, problems with intercourse or other gynecologic concerns. Here for Nexplanon removal/reinsertion. Nexplanon inserted 03/08/2016. LMP 01/12/21. Last PE 01/12/21. Last pap 01/12/21 ASCUS HPV +. Colpo scheduled for 02/23/21 [facility]. Has had sex 3x since 01/12/21 with condoms. This will be her 3rd Nexplanon.    Gynecologic History Patient's last menstrual period was 01/12/2021 (exact date). Contraception: Nexplanon  Health Maintenance Due  Topic Date Due   COVID-19 Vaccine (1) Never done   Pneumococcal Vaccine 34-40 Years old (1 - PCV) Never done   TETANUS/TDAP  Never done   PAP-Cervical Cytology Screening  Never done   INFLUENZA VACCINE  Never done     The following portions of the patient's history were reviewed and updated as appropriate: allergies, current medications, past family history, past medical history, past social history, past surgical history and problem list.  Review of Systems Pertinent items are noted in HPI.   Objective:  BP 133/90   Ht 5' 1.5" (1.562 m)   Wt 159 lb (72.1 kg)   LMP 01/12/2021 (Exact Date)   BMI 29.56 kg/m  Gen: well appearing, NAD HEENT: no scleral icterus CV: RR Lung: Normal WOB Ext: warm well perfused     Assessment and Plan:   Contraception counseling: Reviewed all forms of birth control options in the tiered based approach. available including abstinence; over the counter/barrier methods; hormonal contraceptive medication including pill, patch, ring, injection,contraceptive implant, ECP; hormonal and nonhormonal IUDs; permanent sterilization options including vasectomy and the various tubal sterilization modalities. Risks, benefits, and  typical effectiveness rates were reviewed.  Questions were answered.  Written information was also given to the patient to review.  Patient desires Nexplanon removal/reinsertion this was prescribed for patient. She will follow up in  11-13 wks for surveillance.  She was told to call with any further questions, or with any concerns about this method of contraception.  Emphasized use of condoms 100% of the time for STI prevention.  Patient was offered ECP. ECP was not accepted by the patient. ECP counseling was not given - see RN documentation  1. Encounter for counseling regarding contraception Pt seen by [facility] resident Richrd Sox today - Pregnancy, urine  2. Nexplanon removal Nexplanon Removal Done by [facility] resident Richrd Sox Patient identified, informed consent performed, consent signed.   Appropriate time out taken. Nexplanon site identified.  Area prepped in usual sterile fashon. 3 ml of 1% lidocaine with Epinephrine was used to anesthetize the area at the distal end of the implant and along implant site. A small stab incision was made right beside the implant on the distal portion.  The Nexplanon rod was grasped using straight hemostats/manual and removed without difficulty.  There was minimal blood loss. There were no complications.  Steri-strips were applied over the small incision.  A pressure bandage was applied to reduce any bruising.  The patient tolerated the procedure well and was given post procedure instructions.    - etonogestrel (NEXPLANON) implant 68 mg  3. Nexplanon insertion Nexplanon Insertion Procedure Done by [facility] resident Richrd Sox Patient identified, informed consent performed, consent signed.   Patient does understand that irregular bleeding is a very common side effect of this medication. She was advised to have backup contraception after placement.  Patient was determined to meet WHO criteria for not being pregnant. Appropriate time out taken.  The insertion site was  identified 8-10 cm (3-4 inches) from the medial epicondyle of the humerus and 3-5 cm (1.25-2 inches) posterior to (below) the sulcus (groove) between the biceps and triceps muscles of the patient's left arm and marked.  Patient was prepped with alcohol swab and then injected with 3 ml of 1% lidocaine.  Arm was prepped with chlorhexidene, Nexplanon removed from packaging,  Device confirmed in needle, then inserted full length of needle and withdrawn per handbook instructions. Nexplanon was able to palpated in the patient's arm; patient palpated the insert herself. There was minimal blood loss.  Patient insertion site covered with guaze and a pressure bandage to reduce any bruising.  The patient tolerated the procedure well and was given post procedure instructions.   Nexplanon:   Counseled patient to take OTC analgesic starting as soon as lidocaine starts to wear off and take regularly for at least 48 hr to decrease discomfort.  Specifically to take with food or milk to decrease stomach upset and for IB 600 mg (3 tablets) every 6 hrs; IB 800 mg (4 tablets) every 8 hrs; or Aleve 2 tablets every 12 hrs.   - etonogestrel (NEXPLANON) implant 68 mg    Please refer to After Visit Summary for other counseling recommendations.   Return in about 1 year (around 02/04/2022) for yearly physical exam.  Alberteen Spindle, CNM [facility] DEPARTMENT

## 2021-02-04 NOTE — Progress Notes (Signed)
PT negative. Patient reminder given for colposcopy appointment on 02/23/2021 at [facility]. Patient wrote the information down. Marland KitchenBurt Knack, RN

## 2021-02-04 NOTE — Progress Notes (Signed)
Patient here for Nexplanon removal and reinsertion. Current Nexplanon inserted 03/08/2016. PE 01/12/2021, with Pap. Pap results ASCUS and HPV +. Patient has colpo scheduled at [facility] on 02/23/2021. Has some questions about continuing with Nexplanon.Burt Knack, RN

## 2021-02-04 NOTE — Progress Notes (Signed)
PT negative. Patient reminded of co

## 2021-02-11 ENCOUNTER — Telehealth: Payer: Self-pay | Admitting: Family Medicine

## 2021-02-11 NOTE — Telephone Encounter (Signed)
Patient here now prob with imp.

## 2021-02-23 ENCOUNTER — Other Ambulatory Visit: Payer: Self-pay

## 2021-02-23 ENCOUNTER — Ambulatory Visit (INDEPENDENT_AMBULATORY_CARE_PROVIDER_SITE_OTHER): Payer: Medicaid Other | Admitting: Obstetrics and Gynecology

## 2021-02-23 ENCOUNTER — Other Ambulatory Visit (HOSPITAL_COMMUNITY)
Admission: RE | Admit: 2021-02-23 | Discharge: 2021-02-23 | Disposition: A | Discharge status: Home / Self Care | Payer: Medicaid Other | Source: Ambulatory Visit | Attending: Obstetrics and Gynecology | Admitting: Obstetrics and Gynecology

## 2021-02-23 ENCOUNTER — Encounter: Payer: Self-pay | Admitting: Obstetrics and Gynecology

## 2021-02-23 VITALS — BP 130/80 | Ht 61.0 in | Wt 159.0 lb

## 2021-02-23 DIAGNOSIS — R87810 Cervical high risk human papillomavirus (HPV) DNA test positive: Secondary | ICD-10-CM

## 2021-02-23 DIAGNOSIS — R87610 Atypical squamous cells of undetermined significance on cytologic smear of cervix (ASC-US): Secondary | ICD-10-CM | POA: Diagnosis present

## 2021-02-23 NOTE — Progress Notes (Signed)
Referring Provider:  [facility] Department  HPI:  Kimberly Hansen is a 30 y.o.  [id number]  who presents today for evaluation and management of abnormal cervical cytology.    Dysplasia History: 01/12/2021, ASCUS, HPV+  OB History  Gravida Para Term Preterm AB Living  2 2 1 1  0 2  SAB IAB Ectopic Multiple Live Births  0 0 0 0 2    # Outcome Date GA Lbr Len/2nd Weight Sex Delivery Anes PTL Lv  2 Preterm           1 Term             Past Medical History:  Diagnosis Date   Hiatal hernia    Ovarian cyst     Past Surgical History:  Procedure Laterality Date   DRUG INDUCED ENDOSCOPY     for hernia    SOCIAL HISTORY:  Social History   Substance and Sexual Activity  Alcohol Use Yes   Comment: occ    Social History   Substance and Sexual Activity  Drug Use Not Currently   Types: Marijuana     Family History  Problem Relation Age of Onset   Breast cancer Maternal Grandmother    Other Maternal Grandfather        benign brain tumor   Diabetes Maternal Grandfather    Other Mother        benign brain tumor   Diabetes Mother    Migraines Mother    Asthma Brother    Asthma Daughter     ALLERGIES:  Peanut-containing drug products and Watermelon [citrullus vulgaris]  Current Outpatient Medications on File Prior to Visit  Medication Sig Dispense Refill   etonogestrel (NEXPLANON) 68 MG IMPL implant 1 each by Subdermal route once.     No current facility-administered medications on file prior to visit.    Physical Exam: -Vitals:  BP 130/80   Ht 5\' 1"  (1.549 m)   Wt 159 lb (72.1 kg)   BMI 30.04 kg/m  GEN: WD, WN, NAD.  A+ O x 3, good mood and affect. ABD:  NT, ND.  Soft, no masses.  No hernias noted.   Pelvic:   Vulva: Normal appearance.  No lesions.  Vagina: No lesions or abnormalities noted.  Support: Normal pelvic support.  Urethra No masses tenderness or scarring.  Meatus Normal size without lesions or prolapse.  Cervix: See below.   Anus: Normal exam.  No lesions.  Perineum: Normal exam.  No lesions.        Bimanual   Uterus: Normal size.  Non-tender.  Mobile.  AV.  Adnexae: No masses.  Non-tender to palpation.  Cul-de-sac: Negative for abnormality.   PROCEDURE: 1.  Urine Pregnancy Test:  negative on 9/9 and has Nexplanon 2.  Colposcopy performed with 4% acetic acid after verbal consent obtained                                         -Aceto-white Lesions Location(s): diffusely posteriorly and some anteriorly.              -Biopsy performed at 2, 4, 7, and 10 o'clock               -ECC indicated and performed: Yes.       -Biopsy sites made hemostatic with pressure, AgNO3, and/or Monsel's solution   -Satisfactory colposcopy: No.    -  Evidence of Invasive cervical CA :  NO  ASSESSMENT:  Kimberly Hansen is a 30 y.o. [id number] here for  1. Atypical squamous cell changes of undetermined significance (ASCUS) on cervical cytology with positive high risk human papilloma virus (HPV)   .  PLAN: I discussed the grading system of pap smears and HPV high risk viral types.  We will discuss and base management after colpo results return.     Kimberly Mohair, MD  [facility], [facility] 02/23/2021  10:40 AM   CC: Matt Holmes, PA [address] [place],  [place] [address]

## 2021-02-24 ENCOUNTER — Encounter: Payer: Self-pay | Admitting: Physician Assistant

## 2021-02-24 NOTE — Progress Notes (Signed)
Reveiewd letter from MD regarding patient's visit for follow up of abnormal pap and the patient's visit with MD.

## 2021-02-28 LAB — SURGICAL PATHOLOGY

## 2021-07-14 IMAGING — CR DG CHEST 2V
2 series · 2 of 2 positions shown · non-contrast
Comparison: Prior chest radiographs 04/29/2013.

CLINICAL DATA: Chest pain.

EXAM:
CHEST - 2 VIEW

[chest pa]
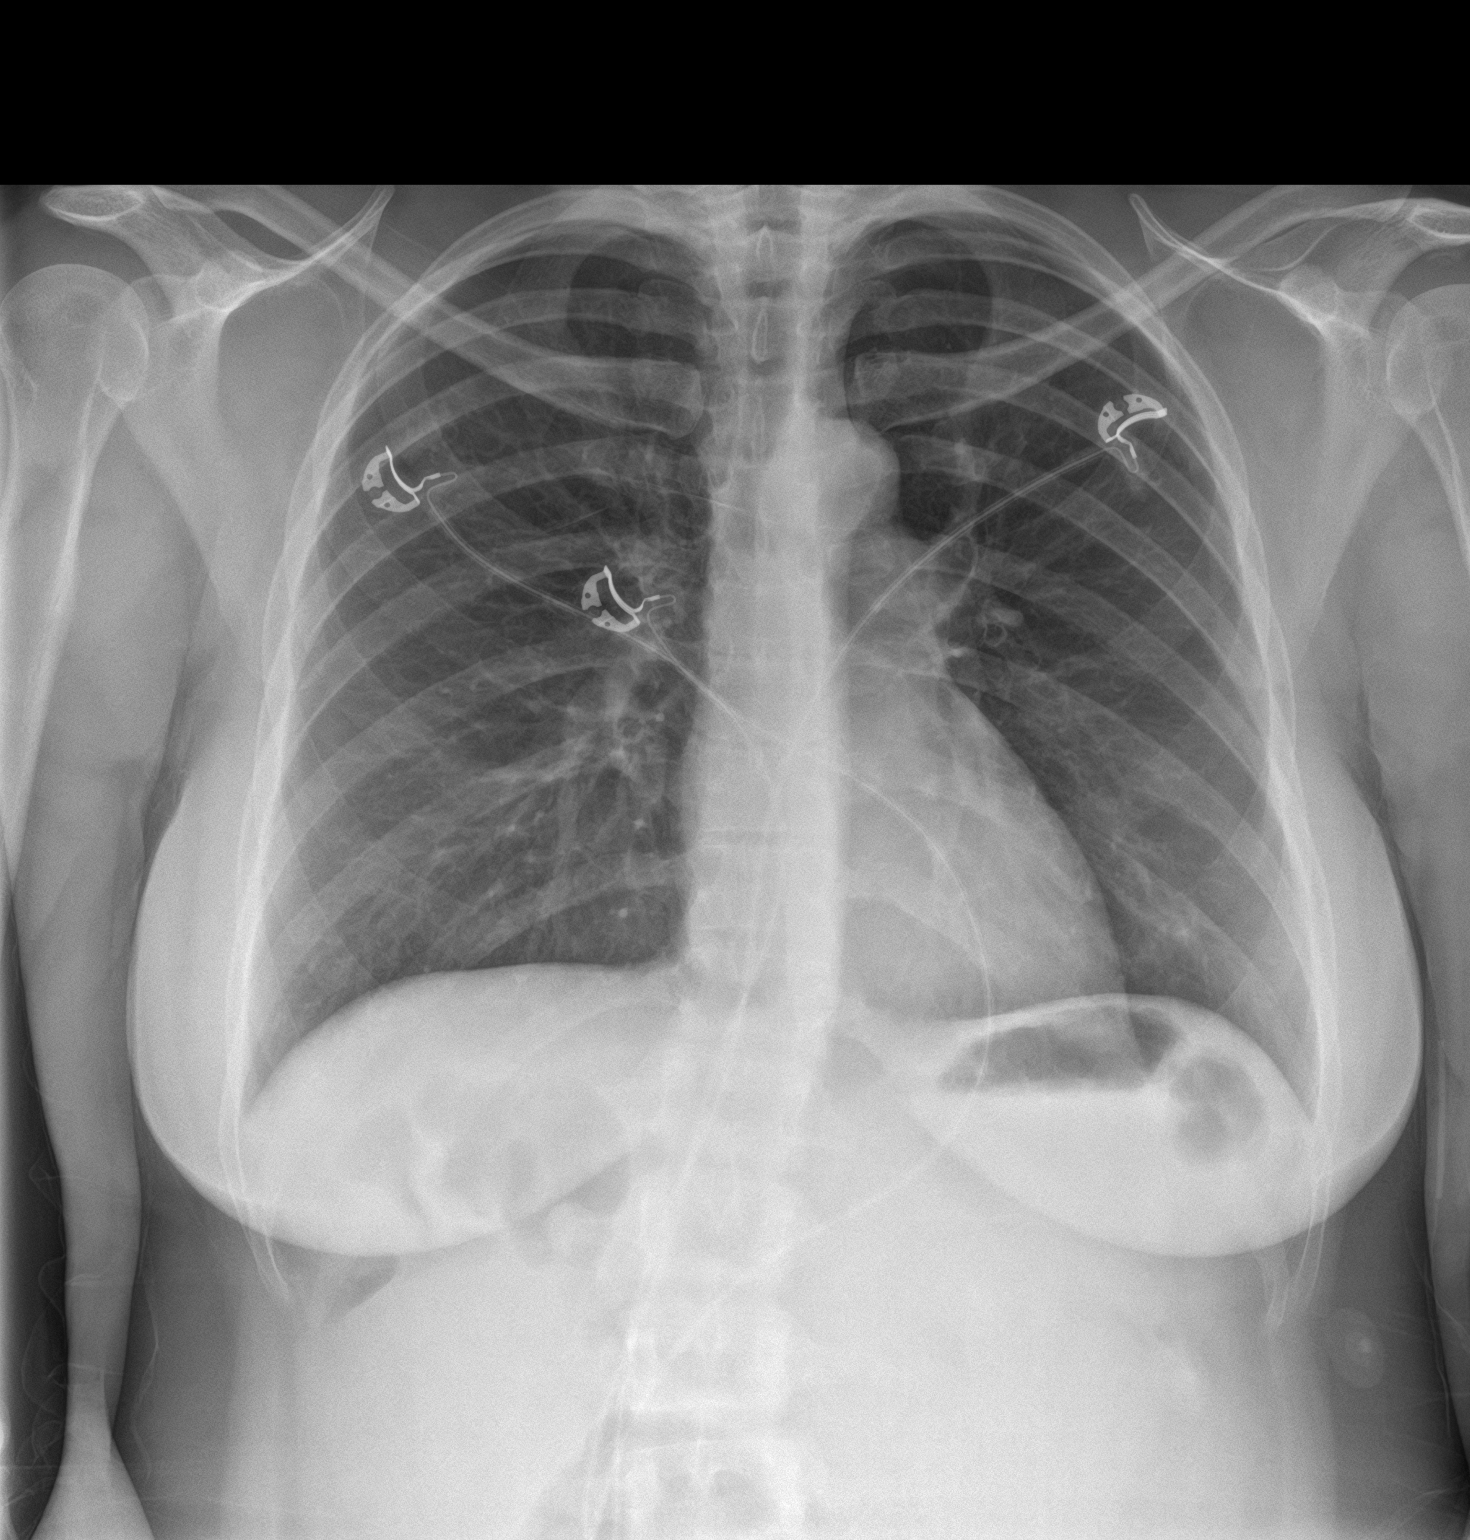

[chest lat]
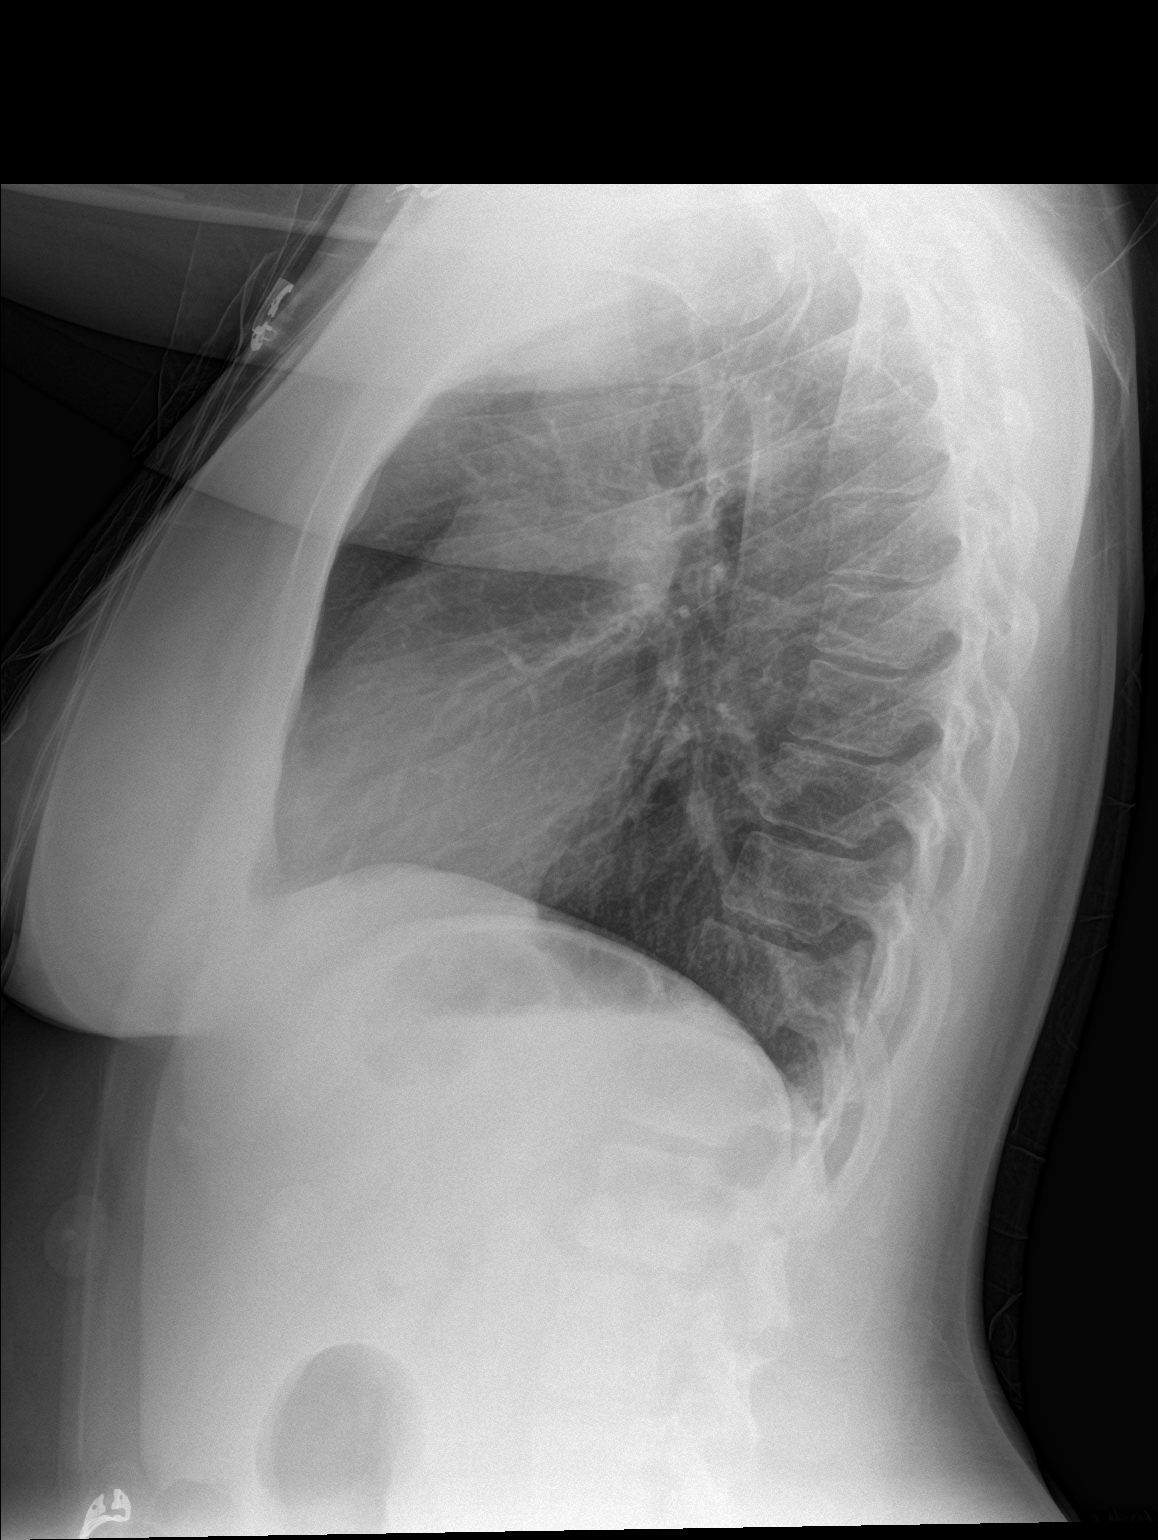

[2 of 2 positions shown; findings below may reference images not displayed]

FINDINGS: Heart size within normal limits.

There is no appreciable airspace consolidation.

No evidence of pleural effusion or pneumothorax.

No acute bony abnormality identified. Redemonstrated mild
thoracolumbar levocurvature.
IMPRESSION: No evidence of acute cardiopulmonary abnormality.

## 2021-12-21 DIAGNOSIS — S8262XA Displaced fracture of lateral malleolus of left fibula, initial encounter for closed fracture: Secondary | ICD-10-CM | POA: Insufficient documentation

## 2022-01-10 ENCOUNTER — Telehealth: Payer: Self-pay

## 2022-01-10 NOTE — Telephone Encounter (Signed)
Telephone call to patient regarding the need for a repeat PAP and physical due after 02-12-2022.  Phone states "the number you have dialed has been disconnected or is no longer in service".  PAP letter mailed today.  Hart Carwin, RN

## 2022-01-16 NOTE — Telephone Encounter (Signed)
Telephone call to patient contact/Mother Kimberly Hansen) at [phone].  Left message for her to please return my call at [phone].  Wanting to inquire about another contact number for patient.  Current number reports being disconnected.  Hart Carwin, RN

## 2022-02-06 NOTE — Telephone Encounter (Signed)
Per Epic, patient has an appointment 02-09-22.  Hart Carwin, RN

## 2022-02-09 ENCOUNTER — Ambulatory Visit (LOCAL_COMMUNITY_HEALTH_CENTER): Payer: Medicaid Other | Admitting: Advanced Practice Midwife

## 2022-02-09 ENCOUNTER — Encounter: Payer: Self-pay | Admitting: Advanced Practice Midwife

## 2022-02-09 VITALS — BP 135/82 | Ht 61.5 in | Wt 164.5 lb

## 2022-02-09 DIAGNOSIS — B9689 Other specified bacterial agents as the cause of diseases classified elsewhere: Secondary | ICD-10-CM

## 2022-02-09 DIAGNOSIS — Z3046 Encounter for surveillance of implantable subdermal contraceptive: Secondary | ICD-10-CM | POA: Diagnosis not present

## 2022-02-09 DIAGNOSIS — F17200 Nicotine dependence, unspecified, uncomplicated: Secondary | ICD-10-CM

## 2022-02-09 DIAGNOSIS — I10 Essential (primary) hypertension: Secondary | ICD-10-CM | POA: Insufficient documentation

## 2022-02-09 DIAGNOSIS — F419 Anxiety disorder, unspecified: Secondary | ICD-10-CM

## 2022-02-09 DIAGNOSIS — Z3009 Encounter for other general counseling and advice on contraception: Secondary | ICD-10-CM | POA: Diagnosis not present

## 2022-02-09 DIAGNOSIS — N760 Acute vaginitis: Secondary | ICD-10-CM

## 2022-02-09 DIAGNOSIS — R87610 Atypical squamous cells of undetermined significance on cytologic smear of cervix (ASC-US): Secondary | ICD-10-CM

## 2022-02-09 LAB — WET PREP FOR TRICH, YEAST, CLUE
Trichomonas Exam: NEGATIVE
Yeast Exam: NEGATIVE

## 2022-02-09 MED ORDER — METRONIDAZOLE 500 MG PO TABS: 500.0000 mg | ORAL_TABLET | Freq: Two times a day (BID) | ORAL | 0 refills | Status: AC

## 2022-02-09 NOTE — Progress Notes (Signed)
WET PREP reveals BV and treated per standing orders. Pt verbalized understanding of side effects of med, avoid sexual contact for 14 days, alcohol for 10 days. HPV information pamphlet given. Pt verbalized understanding of further labwork pending. Lethea Killings RN

## 2022-02-09 NOTE — Progress Notes (Signed)
[facility] [address] Main Number: [phone]    Family Planning Visit- Initial Visit  Subjective:  Kimberly Hansen is a 31 y.o.  SBF smoker G2P1102 (10,9)  being seen today for an initial annual visit and to discuss reproductive life planning.  The patient is currently using Hormonal Implant for pregnancy prevention. Patient reports   does not want a pregnancy in the next year.     report they are looking for a method that provides High efficacy at preventing pregnancy  Patient has the following medical conditions has Abnormal Pap smear of cervix 01/12/21; Overweight BMI=29.5; Anxiety dx'd 2023; and Hypertension dx'd 2023 on their problem list.  Chief Complaint  Patient presents with   Annual Exam    PE, repeat PAP, and "I think I have BV"    Patient reports here for physical and pap. Last PE 01/12/21. Last pap 01/12/21 ASCUS HPV+. 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 Fractured left ankle 12/29/21 with surgery. LMP 02/05/22. Last sex 01/23/22 without condom; with current partner x 10 years; 1 partner in last 3 mo. Nexplanon inserted 02/04/21. Last dental exam summer 2022. Smoking 2-3 cpd. Last vaped 12/2021. Last cigar 2011. Last MJ 2021. Last ETOH 11/11/21 (4 shots brandy) was using daily and quit. Sees counselor at [facility] for anxiety  Patient denies problems  Body mass index is 30.58 kg/m. - Patient is eligible for diabetes screening based on BMI and age >60?  not applicable HA1C ordered? not applicable  Patient reports 1  partner/s in last year. Desires STI screening?  No - declines bloodwork  Has patient been screened once for HCV in the past?  No  No results found for: "HCVAB"  Does the patient have current drug use (including MJ), have a partner with drug use, and/or has been incarcerated since last result? No  If yes-- Screen for HCV through [facility]   Does the patient meet criteria for HBV  testing? No  Criteria:  -Household, sexual or needle sharing contact with HBV -History of drug use -HIV positive -Those with known Hep C   Health Maintenance Due  Topic Date Due   COVID-19 Vaccine (1) Never done   TETANUS/TDAP  Never done   INFLUENZA VACCINE  Never done    Review of Systems  Neurological:  Positive for headaches (daily since 02/05/22 in different parts of head due to not eating well/enough, relieved with IB, Advil).    The following portions of the patient's history were reviewed and updated as appropriate: allergies, current medications, past family history, past medical history, past social history, past surgical history and problem list. Problem list updated.   See flowsheet for other program required questions.  Objective:   Vitals:   02/09/22 0837  BP: 135/82  Weight: 164 lb 8 oz (74.6 kg)  Height: 5' 1.5" (1.562 m)    Physical Exam Constitutional:      Appearance: Normal appearance. She is obese.  HENT:     Head: Normocephalic and atraumatic.     Mouth/Throat:     Mouth: Mucous membranes are moist.  Eyes:     Conjunctiva/sclera: Conjunctivae normal.  Neck:     Thyroid: No thyroid mass, thyromegaly or thyroid tenderness.  Cardiovascular:     Rate and Rhythm: Normal rate and regular rhythm.  Pulmonary:     Effort: Pulmonary effort is normal.     Breath sounds: Normal breath sounds.  Chest:  Breasts:  Right: Normal.     Left: Normal.  Abdominal:     Palpations: Abdomen is soft.     Comments: Soft without masses or tenderness, fair tone  Genitourinary:    General: Normal vulva.     Exam position: Lithotomy position.     Vagina: Vaginal discharge (white creamy leukorrhea, ph<4.5) present.     Cervix: Friability (sl friable to pap) present.     Uterus: Normal.      Adnexa: Right adnexa normal and left adnexa normal.     Rectum: Normal.     Comments: Pap done Musculoskeletal:        General: Normal range of motion.     Cervical  back: Normal range of motion and neck supple.  Skin:    General: Skin is warm and dry.  Neurological:     Mental Status: She is alert.  Psychiatric:        Mood and Affect: Mood normal.      Assessment and Plan:  Kimberly Hansen is a 31 y.o. female presenting to the [facility] Department for an initial annual wellness/contraceptive visit  Contraception counseling: Reviewed options based on patient desire and reproductive life plan. Patient is interested in Hormonal Implant. This was not provided to the patient today. Pt already has Nexplanon.  if not why not clearly documented  Risks, benefits, and typical effectiveness rates were reviewed.  Questions were answered.  Written information was also given to the patient to review.    The patient will follow up in  1 years for surveillance.  The patient was told to call with any further questions, or with any concerns about this method of contraception.  Emphasized use of condoms 100% of the time for STI prevention.  Need for ECP was assessed. Patient reported not meeting criteria.  Reviewed options and patient desired No method of ECP, declined all    1. Family planning Please give pt dental list Treat wet mount per standing orders Immunization nurse consult   - WET PREP FOR TRICH, YEAST, CLUE - Chlamydia/Gonorrhea [facility] - IGP, Aptima HPV  2. Encounter for surveillance of implantable subdermal contraceptive Happy with Nexplanon inserted 02/04/2021  3. Anxiety dx'd 2023 Receiving counseling from [facility] On meds  4. Hypertension, unspecified type On meds  5. Atypical squamous cells of undetermined significance on cytologic smear of cervix (ASC-US) ASCUS HPV+ 01/12/21 Colpo 02/23/21 Repeat cotest today      No follow-ups on file.  No future appointments.  Alberteen Spindle, CNM

## 2022-02-20 LAB — IGP, APTIMA HPV
HPV Aptima: NEGATIVE
PAP Smear Comment: 0

## 2023-02-28 ENCOUNTER — Emergency Department
Admission: EM | Admit: 2023-02-28 | Discharge: 2023-03-01 | Disposition: A | Discharge status: Home / Self Care | Payer: Medicaid Other | Attending: Emergency Medicine | Admitting: Emergency Medicine

## 2023-02-28 DIAGNOSIS — R112 Nausea with vomiting, unspecified: Secondary | ICD-10-CM | POA: Diagnosis present

## 2023-02-28 DIAGNOSIS — E860 Dehydration: Secondary | ICD-10-CM | POA: Diagnosis not present

## 2023-02-28 DIAGNOSIS — R109 Unspecified abdominal pain: Secondary | ICD-10-CM | POA: Insufficient documentation

## 2023-02-28 DIAGNOSIS — I10 Essential (primary) hypertension: Secondary | ICD-10-CM | POA: Insufficient documentation

## 2023-02-28 LAB — COMPREHENSIVE METABOLIC PANEL
ALT: 17 U/L (ref 0–44)
AST: 23 U/L (ref 15–41)
Albumin: 5.1 g/dL — ABNORMAL HIGH (ref 3.5–5.0)
Alkaline Phosphatase: 81 U/L (ref 38–126)
Anion gap: 19 — ABNORMAL HIGH (ref 5–15)
BUN: 6 mg/dL (ref 6–20)
CO2: 24 mmol/L (ref 22–32)
Calcium: 9.2 mg/dL (ref 8.9–10.3)
Chloride: 96 mmol/L — ABNORMAL LOW (ref 98–111)
Creatinine, Ser: 0.88 mg/dL (ref 0.44–1.00)
GFR, Estimated: >60 mL/min (ref >60)
Glucose, Bld: 122 mg/dL — ABNORMAL HIGH (ref 70–99)
Potassium: 3.3 mmol/L — ABNORMAL LOW (ref 3.5–5.1)
Sodium: 139 mmol/L (ref 135–145)
Total Bilirubin: 1.1 mg/dL (ref 0.3–1.2)
Total Protein: 8.9 g/dL — ABNORMAL HIGH (ref 6.5–8.1)

## 2023-02-28 LAB — CBC
HCT: 38.8 % (ref 36.0–46.0)
Hemoglobin: 12.8 g/dL (ref 12.0–15.0)
MCH: 29.2 pg (ref 26.0–34.0)
MCHC: 33 g/dL (ref 30.0–36.0)
MCV: 88.4 fL (ref 80.0–100.0)
Platelets: 412 10*3/uL — ABNORMAL HIGH (ref 150–400)
RBC: 4.39 MIL/uL (ref 3.87–5.11)
RDW: 14.5 % (ref 11.5–15.5)
WBC: 9.9 10*3/uL (ref 4.0–10.5)
nRBC: 0 % (ref 0.0–0.2)

## 2023-02-28 LAB — LIPASE, BLOOD: Lipase: 19 U/L (ref 11–51)

## 2023-02-28 MED ORDER — METOCLOPRAMIDE HCL 5 MG/ML IJ SOLN
10.0000 mg | Freq: Once | INTRAMUSCULAR | Status: AC
  Administered 2023-03-01: 10 mg via INTRAVENOUS
  Filled 2023-02-28: qty 2

## 2023-02-28 MED ORDER — SODIUM CHLORIDE 0.9 % IV BOLUS
1000.0000 mL | Freq: Once | INTRAVENOUS | Status: AC
  Administered 2023-03-01: 1000 mL via INTRAVENOUS

## 2023-02-28 MED ORDER — MORPHINE SULFATE (PF) 4 MG/ML IV SOLN
4.0000 mg | Freq: Once | INTRAVENOUS | Status: AC
  Administered 2023-03-01: 4 mg via INTRAVENOUS
  Filled 2023-02-28: qty 1

## 2023-02-28 NOTE — ED Triage Notes (Signed)
Pt arrived via [facility] from home with c/o N/V x24 hours with bile in color. Pt received 8mg  Zofran and NS in route by EMS, via IV. Pt with relief since.

## 2023-02-28 NOTE — ED Triage Notes (Signed)
Pt BIB [facility] for N/V since last night with mid abd pain, reports decrease PO intake. EMS administer 8 mg Zofran and 300 ml bolus in route. Pt reports hx of GERD & hiatal hernia. HTN noted, hx of same. A&O x4.

## 2023-03-01 ENCOUNTER — Emergency Department: Payer: Medicaid Other

## 2023-03-01 LAB — URINALYSIS, ROUTINE W REFLEX MICROSCOPIC
Bacteria, UA: NONE SEEN
Glucose, UA: 50 mg/dL — AB
Hgb urine dipstick: NEGATIVE
Ketones, ur: 20 mg/dL — AB
Leukocytes,Ua: NEGATIVE
Nitrite: NEGATIVE
Protein, ur: >=300 mg/dL — AB
Specific Gravity, Urine: 1.03 (ref 1.005–1.030)
pH: 5 (ref 5.0–8.0)

## 2023-03-01 LAB — TROPONIN I (HIGH SENSITIVITY): Troponin I (High Sensitivity): 8 ng/L (ref <18)

## 2023-03-01 LAB — POC URINE PREG, ED: Preg Test, Ur: NEGATIVE

## 2023-03-01 MED ORDER — LIDOCAINE VISCOUS HCL 2 % MT SOLN
15.0000 mL | Freq: Once | OROMUCOSAL | Status: AC
  Administered 2023-03-01: 15 mL via ORAL
  Filled 2023-03-01: qty 15

## 2023-03-01 MED ORDER — ONDANSETRON 4 MG PO TBDP: 4.0000 mg | ORAL_TABLET | Freq: Three times a day (TID) | ORAL | 0 refills | Status: AC | PRN

## 2023-03-01 MED ORDER — MAALOX MULTI SYMPTOM MAX ST 400-400-40 MG/5ML PO SUSP: 10.0000 mL | Freq: Four times a day (QID) | ORAL | 0 refills | Status: AC | PRN

## 2023-03-01 MED ORDER — ALUM & MAG HYDROXIDE-SIMETH 200-200-20 MG/5ML PO SUSP
30.0000 mL | Freq: Once | ORAL | Status: AC
  Administered 2023-03-01: 30 mL via ORAL
  Filled 2023-03-01: qty 30

## 2023-03-01 MED ORDER — IOHEXOL 300 MG/ML  SOLN
100.0000 mL | Freq: Once | INTRAMUSCULAR | Status: AC | PRN
  Administered 2023-03-01: 100 mL via INTRAVENOUS

## 2023-03-01 NOTE — ED Notes (Signed)
 Provided pt with discharge instructions and education. All of pt questions answered. Pt in possession of all belongings. Pt AAOX4 and stable at time of discharge.Pt ambulated w/ steady gait towards ED exit.

## 2023-03-01 NOTE — ED Notes (Signed)
Pt ambulated to restroom and back to bed safely.

## 2023-03-01 NOTE — Discharge Instructions (Signed)
Please use your nausea medication as needed, as prescribed.  Please drink plenty of fluids.  Please use your prescribed Maalox every 6 hours as needed for indigestion type symptoms.  Return to the emergency department for any return of/worsening pain, or any other symptom personally concerning to yourself.

## 2023-03-01 NOTE — ED Provider Notes (Signed)
[facility] Provider Note    Event Date/Time   First MD Initiated Contact with Patient 02/28/23 2334     (approximate)  History   Chief Complaint: Nausea and Emesis  HPI  Kimberly Hansen is a 32 y.o. female with a past medical history of hypertension presents to the emergency department for nausea vomiting abdominal pain.  According to the patient since last night (approximately 24 hours) patient has been nauseated with frequent episodes of vomiting and is also complaining of some mid abdominal pain.  Patient states at times she feels the pain more into her chest but relates this to the vomiting.  Denies any diarrhea.  Patient found to be quite hypertensive in the emergency department 180/128.  Patient received Zofran and 300 cc of IV fluids by EMS states she is feeling somewhat better but continues to state nausea as well as mid abdominal discomfort.  Denies any urinary symptoms.  Patient has Nexplanon unsure of last menstrual period.  Physical Exam   Triage Vital Signs: ED Triage Vitals  Encounter Vitals Group     BP 02/28/23 2044 (!) 182/125     Systolic BP Percentile --      Diastolic BP Percentile --      Pulse Rate 02/28/23 2044 (!) 104     Resp 02/28/23 2044 20     Temp 02/28/23 2044 98.4 F (36.9 C)     Temp Source 02/28/23 2044 Oral     SpO2 02/28/23 2044 99 %     Weight 02/28/23 2059 165 lb (74.8 kg)     Height 02/28/23 2059 5\' 2"  (1.575 m)     Head Circumference --      Peak Flow --      Pain Score 02/28/23 2059 10     Pain Loc --      Pain Education --      Exclude from Growth Chart --     Most recent vital signs: Vitals:   02/28/23 2044 02/28/23 2101  BP: (!) 182/125 (!) 173/129  Pulse: (!) 104   Resp: 20   Temp: 98.4 F (36.9 C)   SpO2: 99%     General: Awake, no distress.  CV:  Good peripheral perfusion.  Regular rate and rhythm  Resp:  Normal effort.  Equal breath sounds bilaterally.  Abd:  No distention.  Soft, mild  diffuse tenderness without focal tenderness identified.  No rebound guarding or distention.  ED Results / Procedures / Treatments   EKG  EKG viewed and interpreted by myself shows sinus tachycardia 102 bpm with a narrow QRS, normal axis, normal intervals, nonspecific ST changes.  No ST elevation.  RADIOLOGY  I have reviewed and interpreted CT images.  Patient does appear to have some fluid in her small intestine but do not see any signs of bowel obstruction or other concerning finding. Radiology is read the CT scan is essentially negative.   MEDICATIONS ORDERED IN ED: Medications  morphine (PF) 4 MG/ML injection 4 mg (4 mg Intravenous Given 03/01/23 0008)  sodium chloride 0.9 % bolus 1,000 mL (1,000 mLs Intravenous New Bag/Given 03/01/23 0006)  metoCLOPramide (REGLAN) injection 10 mg (10 mg Intravenous Given 03/01/23 0007)     IMPRESSION / MDM / ASSESSMENT AND PLAN / ED COURSE  I reviewed the triage vital signs and the nursing notes.  Patient's presentation is most consistent with acute presentation with potential threat to life or bodily function.  Patient presents to the emergency department  for nausea vomiting as well as mid abdominal discomfort ongoing for approximately 24 hours at this time.  Patient states mild chest discomfort at times although denies any currently.  Relates this more to the vomiting.  Added on a troponin as a precaution which has resulted normal.  EKG shows nonspecific findings.  Patient's labs have resulted showing a reassuring CBC with a normal white blood cell count, reassuring chemistry besides an elevated anion gap normal glucose, reassuring lipase and LFTs.  We will continue with IV hydration and we will treat the patient's pain and nausea in the emergency department and obtain a CT scan to further evaluate.  Patient agreeable to plan of care and workup.  Patient's workup overall reassuring, pregnancy test is negative, urinalysis is normal, lipase is normal  patient's chemistry does show anion gap of 19 with the patient has received approximately 1.5 L of IV fluids CBC is normal to normal white blood cell count and troponin is negative.  CT scan has resulted normal as well.  Patient given a GI cocktail which she states has diminished symptoms somewhat but continues to feel a sensation like she needs to belch.  Patient states however her ride is here and she needs to leave is asking for discharge paperwork.  Provided my typical chest and abdominal pain return precautions.  Will prescribe Maalox with simethicone as well as Zofran to be used as needed.  Patient agreeable to plan.  FINAL CLINICAL IMPRESSION(S) / ED DIAGNOSES   Abdominal pain Nausea vomiting Dehydration  Note:  This document was prepared using Dragon voice recognition software and may include unintentional dictation errors.   Minna Antis, MD 03/01/23 [id number]

## 2023-09-01 DIAGNOSIS — R1011 Right upper quadrant pain: Secondary | ICD-10-CM | POA: Insufficient documentation

## 2023-09-01 DIAGNOSIS — K828 Other specified diseases of gallbladder: Secondary | ICD-10-CM | POA: Insufficient documentation

## 2023-09-01 DIAGNOSIS — F419 Anxiety disorder, unspecified: Secondary | ICD-10-CM | POA: Insufficient documentation

## 2023-09-01 DIAGNOSIS — I871 Compression of vein: Secondary | ICD-10-CM | POA: Insufficient documentation

## 2023-09-19 DIAGNOSIS — K920 Hematemesis: Secondary | ICD-10-CM | POA: Insufficient documentation

## 2023-09-19 DIAGNOSIS — R7989 Other specified abnormal findings of blood chemistry: Secondary | ICD-10-CM | POA: Insufficient documentation

## 2023-09-19 DIAGNOSIS — R1013 Epigastric pain: Secondary | ICD-10-CM | POA: Insufficient documentation

## 2023-09-19 DIAGNOSIS — K226 Gastro-esophageal laceration-hemorrhage syndrome: Secondary | ICD-10-CM | POA: Insufficient documentation

## 2023-10-18 ENCOUNTER — Ambulatory Visit

## 2023-10-18 DIAGNOSIS — K2100 Gastro-esophageal reflux disease with esophagitis, without bleeding: Secondary | ICD-10-CM | POA: Diagnosis not present

## 2023-10-18 DIAGNOSIS — K449 Diaphragmatic hernia without obstruction or gangrene: Secondary | ICD-10-CM | POA: Diagnosis not present

## 2023-10-18 DIAGNOSIS — K2980 Duodenitis without bleeding: Secondary | ICD-10-CM | POA: Diagnosis not present

## 2023-11-06 ENCOUNTER — Encounter: Payer: Self-pay | Admitting: Nurse Practitioner

## 2023-11-06 ENCOUNTER — Ambulatory Visit: Admitting: Nurse Practitioner

## 2023-11-06 DIAGNOSIS — Z113 Encounter for screening for infections with a predominantly sexual mode of transmission: Secondary | ICD-10-CM | POA: Diagnosis not present

## 2023-11-06 DIAGNOSIS — B9689 Other specified bacterial agents as the cause of diseases classified elsewhere: Secondary | ICD-10-CM

## 2023-11-06 DIAGNOSIS — F419 Anxiety disorder, unspecified: Secondary | ICD-10-CM

## 2023-11-06 LAB — HM HEPATITIS C SCREENING LAB: HM Hepatitis Screen: NEGATIVE

## 2023-11-06 LAB — HM HIV SCREENING LAB: HM HIV Screening: NEGATIVE

## 2023-11-06 MED ORDER — METRONIDAZOLE 500 MG PO TABS: 500.0000 mg | ORAL_TABLET | Freq: Two times a day (BID) | ORAL | 0 refills | Status: AC

## 2023-11-06 NOTE — Progress Notes (Signed)
 Pt here for STI screening.  Wet mount results reviewed with patient.  Metronidazole  500mg  #14 dispensed to pt.  Counseled re medication, side effects, plan of care and when to contact clinic with questions or concerns.  Verbalizes Matias Soman, RN

## 2023-11-06 NOTE — Progress Notes (Signed)
 [facility] STI clinic [address] [place] [address] Main phone: [phone]  STI screening visit  Subjective:  Kimberly Hansen is a 33 y.o. female being seen today for an STI screening visit. The patient reports they do have symptoms.  Patient reports that they do not desire a pregnancy in the next year.   They reported they are not interested in discussing contraception today.    Patient's last menstrual period was 10/16/2023 (approximate).  Patient has the following medical conditions:  Patient Active Problem List   Diagnosis Date Noted   Anxiety dx'd 2023 02/09/2022   Hypertension dx'd 2023 02/09/2022   Smoker 2-3 cpd 02/09/2022   Abnormal Pap smear of cervix 01/12/21 ASCUS HPV+ 02/04/2021   Overweight BMI=29.5 02/04/2021   Chief Complaint  Patient presents with   SEXUALLY TRANSMITTED DISEASE    HPI Patient is a pleasant 33 y.o. female who presents to the office today requesting symptomatic STI testing. Patient reports symptoms include bump to rectum that she thinks may be a hemorrhoid or something else.  Patient indicates 1 female partner in the last 2 months. She reports practicing vaginal and oral sex and uses condoms always. Patient indicates no STI history. Patient reports last sex was 8 days ago. She indicates use of implant as contraception method.  Patient indicates LMP was a few weeks ago and she rarely has periods with hormonal implant in place.    See flowsheet for further details and programmatic requirements Hyperlink available at the top of the signed note in blue.  Flow sheet content below:  Pregnancy Intention Screening Does the patient want to become pregnant in the next year?: No Does the patient's partner want to become pregnant in the next year?: No Would the patient like to discuss contraceptive options today?: No All Patients Anyone smoke around pt and/or pt's children?: Yes Anyone smoke  inside pt's house?: Yes Anyone smoke inside car?: Yes Anyone smoke inside the workplace?: Yes Reason For STD Screen STD Screening: Has symptoms Have you ever had an STD?: No History of Antibiotic use in the past 2 weeks?: No STD Symptoms Denies all: No Genital Itching: No Lower abdominal pain: No Discharge: Yes Discharge s/s: increased amount, brownish, with odor, regular consistency, for 1 week Dysuria: No Genital ulcer / lesion: No Rash: No Vaginal irritation: No Oral / Other skin ulcer: No Pain with sex: No Sore Throat: Yes Sore throat sex s/s: A little bit Visual Changes: No Vaginal Bleeding: No Other (Describe in Comments): Yes Other s/s: hemorroid flare Risk Factors for Hep B Household, sexual, or needle sharing contact of a person infected with Hep B: No Sexual contact with a person who uses drugs not as prescribed?: No Currently or Ever used drugs not as prescribed: No HIV Positive: No PRep Patient: No Men who have sex with men: N/A Have Hepatitis C: No History of Incarceration: No History of Homeslessness?: No Anal sex following anal drug use?: No Risk Factors for Hep C Currently using drugs not as prescribed: No Sexual partner(s) currently using drugs as not prescribed: No History of drug use: Yes HIV Positive: No People with a history of incarceration: No People born between the years of [birth year] and 1965: No Hepatitis Counseling Hep C Counseling: Counseled patient about increased risk of Hep C and recommendation for testing, Patient accepts testing for Hep C today Abuse History Has patient ever been abused physically?: No Has patient ever been abused sexually?: No Does patient  feel they have a problem with Anxiety?: Yes Does patient feel they have a problem with Depression?: Yes Referral to Behavioral Health: Yes Counseling Patient counseled to use condoms with all sex: Condoms declined RTC in 2-3 weeks for test results: Yes Clinic will call if test  results abnormal before test result appt.: Yes Test results given to patient Patient counseled to use condoms with all sex: Condoms declined Contraception Wrap Up Current Method: Hormonal Implant End Method: Hormonal Implant Contraception Counseling Provided: Yes How was the end contraceptive method provided?: N/A   Screening for MPX risk: Does the patient have an unexplained rash? No Is the patient MSM? No Does the patient endorse multiple sex partners or anonymous sex partners? No Did the patient have close or sexual contact with a person diagnosed with MPX? No Has the patient traveled outside the [place]  where MPX is endemic? No Is there a high clinical suspicion for MPX-- evidenced by one of the following No  -Unlikely to be chickenpox  -Lymphadenopathy  -Rash that present in same phase of evolution on any given body part  Screenings: Last HIV test per patient/review of record was  Lab Results  Component Value Date   HMHIVSCREEN Negative - Validated 01/12/2021   No results found for: HIV   Last HEPC test per patient/review of record was  Lab Results  Component Value Date   HMHEPCSCREEN Negative-Validated 01/12/2021   No components found for: HEPC   Last HEPB test per patient/review of record was No components found for: HMHEPBSCREEN   Patient reports last pap was:   Lab Results  Component Value Date   SPECADGYN Comment 02/09/2022   Result Date Procedure Results Follow-ups  02/09/2022 IGP, Aptima HPV DIAGNOSIS:: Comment Specimen adequacy:: Comment Clinician Provided ICD10: Comment Performed by:: Comment PAP Smear Comment: . Note:: Comment Test Methodology: Comment HPV Aptima: Negative   02/23/2021 Surgical pathology SURGICAL PATHOLOGY: SURGICAL PATHOLOGY CASE: [id number] PATIENT: Levern Reader Surgical Pathology Report     Clinical History: ASCUS with positive high risk HPV (cm)     FINAL MICROSCOPIC DIAGNOSIS:  A. CERVIX, 2 O'CLOCK,  BIOPSY: - Koilocytic atypia...   01/12/2021 IGP, rfx Aptima HPV ASCU DIAGNOSIS:: Comment (A) Test Methodology: Comment PAP Reflex: Comment Recommendation:: Comment (A) Specimen adequacy:: Comment Clinician Provided ICD10: Comment Performed by:: Comment Electronically signed by:: Comment PAP Smear Comment: . PATHOLOGIST PROVIDED ICD10:: Comment Note:: Comment     Immunization history:   There is no immunization history on file for this patient.  The following portions of the patient's history were reviewed and updated as appropriate: allergies, current medications, past medical history, past social history, past surgical history and problem list.  Objective:  There were no vitals filed for this visit.  Physical Exam Nursing note reviewed. Exam conducted with a chaperone present Susanna Epley, CNA).  Constitutional:      Appearance: Normal appearance.  HENT:     Head: Normocephalic.     Salivary Glands: Right salivary gland is not diffusely enlarged or tender. Left salivary gland is not diffusely enlarged or tender.     Mouth/Throat:     Lips: Pink. No lesions.     Mouth: Mucous membranes are moist.     Tongue: No lesions. Tongue does not deviate from midline.     Pharynx: Oropharynx is clear. Uvula midline.     Tonsils: No tonsillar exudate.   Eyes:     General:        Right eye: No discharge.  Left eye: No discharge.   Pulmonary:     Effort: Pulmonary effort is normal.  Genitourinary:    General: Normal vulva.     Exam position: Lithotomy position.     Pubic Area: No rash or pubic lice.      Tanner stage (genital): 5.     Labia:        Right: No rash, tenderness, lesion or injury.        Left: No rash, tenderness, lesion or injury.      Vagina: Vaginal discharge present. No erythema, tenderness, bleeding or lesions.     Cervix: Discharge present. No cervical motion tenderness, friability, lesion, erythema, cervical bleeding or eversion.     Uterus: Normal.       Adnexa: Right adnexa normal and left adnexa normal.     Rectum: External hemorrhoid present.      Comments: pH>4.5 Minimal amount of white, malodorous vaginal discharge present within vaginal canal. Diagram indicates location of external hemorrhoid.  Lymphadenopathy:     Head:     Right side of head: No submental, submandibular, tonsillar, preauricular or posterior auricular adenopathy.     Left side of head: No submental, submandibular, tonsillar, preauricular or posterior auricular adenopathy.     Cervical: No cervical adenopathy.     Right cervical: No superficial or posterior cervical adenopathy.    Left cervical: No superficial or posterior cervical adenopathy.     Upper Body:     Right upper body: No supraclavicular or axillary adenopathy.     Left upper body: No supraclavicular or axillary adenopathy.     Lower Body: No right inguinal adenopathy. No left inguinal adenopathy.   Skin:    General: Skin is warm and dry.     Findings: No rash.     Comments: Skin tone appropriate for ethnicity.    Neurological:     Mental Status: She is alert and oriented to person, place, and time.   Psychiatric:        Attention and Perception: Attention and perception normal.        Mood and Affect: Mood and affect normal.        Speech: Speech normal.        Behavior: Behavior normal. Behavior is cooperative.        Thought Content: Thought content normal.    Assessment and Plan:  Kamarah B Muench is a 33 y.o. female presenting to the [facility] Department for STI screening  1. Screening for venereal disease (Primary)  - Chlamydia/Gonorrhea [facility] - Gonococcus culture - HBV Antigen/Antibody [facility] - HIV/HCV [facility] - Syphilis Serology, [facility] - WET PREP FOR TRICH, YEAST, CLUE  2. Anxiety dx'd 2023 Per patient request, referral placed to Nyle Belling, LCSW for services related to anxiety.  - Ambulatory referral to Behavioral Health  3.  Bacterial vaginosis Treating for BV as AMSEL criteria met with 3/4 symptoms/signs present including patient reported abnormal discharge, pH>4.5 on PE, and amine odor present on PE.   - metroNIDAZOLE  (FLAGYL ) 500 MG tablet; Take 1 tablet (500 mg total) by mouth 2 (two) times daily for 7 days.  Dispense: 14 tablet; Refill: 0  Patient accepted the following screenings: oral GC culture, vaginal CT/GC swab, vaginal wet prep, HIV, RPR, Hep B, and Hep C Patient meets criteria for HepB screening? Yes. Ordered? yes Patient meets criteria for HepC screening? Yes. Ordered? yes  Treat wet prep per standing order Discussed time line  for [facility] Lab results and that patient will be called with positive results and encouraged patient to call if she had not heard in 2 weeks.  Counseled to return or seek care for continued or worsening symptoms Recommended repeat testing in 3 months with positive results. Recommended condom use with all sex for STI prevention.   Patient is currently using *Nexplanon  to prevent pregnancy.    Return if symptoms worsen or fail to improve.  No future appointments.  Total time with patient 30 minutes.   Merleen Stare, NP

## 2023-11-07 LAB — WET PREP FOR TRICH, YEAST, CLUE
Clue Cell Exam: NEGATIVE
Trichomonas Exam: NEGATIVE
Yeast Exam: NEGATIVE

## 2023-11-08 LAB — HBV ANTIGEN/ANTIBODY STATE LAB
Hep B Core Total Ab: NONREACTIVE
Hep B S Ab: NONREACTIVE
Hepatitis B Surface Antigen: NONREACTIVE

## 2023-11-11 LAB — SPECIMEN STATUS REPORT

## 2023-11-11 LAB — GONOCOCCUS CULTURE

## 2023-11-12 ENCOUNTER — Ambulatory Visit: Admission: EM | Admit: 2023-11-12 | Discharge: 2023-11-12 | Disposition: A | Discharge status: Home / Self Care

## 2023-11-12 ENCOUNTER — Ambulatory Visit: Payer: Self-pay | Admitting: Nurse Practitioner

## 2023-11-12 DIAGNOSIS — R202 Paresthesia of skin: Secondary | ICD-10-CM | POA: Diagnosis not present

## 2023-11-12 DIAGNOSIS — R200 Anesthesia of skin: Secondary | ICD-10-CM

## 2023-11-12 DIAGNOSIS — Z8489 Family history of other specified conditions: Secondary | ICD-10-CM

## 2023-11-12 DIAGNOSIS — R1084 Generalized abdominal pain: Secondary | ICD-10-CM

## 2023-11-12 DIAGNOSIS — R42 Dizziness and giddiness: Secondary | ICD-10-CM | POA: Diagnosis not present

## 2023-11-12 NOTE — ED Triage Notes (Signed)
 Patient presents to UC for lower extremity and abdomen numbness x 1 week. Oral numbness x 2-3 days. States her mouth has a burning sensation. No hx of DM or neuropathy. Treating pain with tylenol .

## 2023-11-12 NOTE — ED Provider Notes (Signed)
 [facility]    CSN: [id number] Arrival date & time: 11/12/23  0910      History   Chief Complaint Chief Complaint  Patient presents with   Numbness    HPI Kimberly Hansen is a 33 y.o. female with history of hypertension, tobacco abuse, and GERD.  Patient reports 10 day history of bilateral leg pain and numbness/weakness as well as numbness of lower abdomen. Denies falls or injury.  Reports loss of temperature sensation x 2-3 days. Loss of taste. States she cannot tell if something is hot or cold until it hits the  back of her throat. Denies difficulty swallowing or drooling. Reports slurred speech but says she cannot feel her tongue. She rubs her tongue along her teeth and says she cannot feel it.  Reports dizziness when standing too fast or with bending forward. Has been fatigued over the past one week. Denies headaches, vision changes, nausea vomiting. Denies chest pain, shortness of breath. Denies fever, cough, congestion. Denies back or neck pain, joint swelling. No recent illness.  Reports upper abdominal pain for the past couple of days. She says it is generalized but worse of the upper abdomen. No diarrhea or urinary symptoms.   No personal history of neurological problems.   Mother and grandfather have history benign brain tumors.   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   Right upper quadrant abdominal pain 09/01/2023   Anxiety dx'd 2023 02/09/2022   Hypertension dx'd 2023 02/09/2022   Smoker 2-3 cpd 02/09/2022   Closed displaced fracture of lateral malleolus of left fibula 12/21/2021    Abnormal Pap smear of cervix 01/12/21 ASCUS HPV+ 02/04/2021   Overweight BMI=29.5 02/04/2021    Past Surgical History:  Procedure Laterality Date   DRUG INDUCED ENDOSCOPY     for hernia   ORTHOPEDIC SURGERY Left    tibial fracture    OB History     Gravida  2   Para  2   Term  1   Preterm  1   AB  0   Living  2      SAB  0   IAB  0   Ectopic  0   Multiple  0   Live Births  2            Home Medications    Prior to Admission medications   Medication Sig Start Date End Date Taking? Authorizing Provider  alum & mag hydroxide-simeth (MAALOX MULTI SYMPTOM MAX ST) 400-400-40 MG/5ML suspension Take 10 mLs by mouth every 6 (six) hours as needed for indigestion. 03/01/23   Ruth Cove, MD  amLODipine  (NORVASC ) 5 MG tablet Take 5 mg by mouth daily. 11/17/21   [provider]  calcium carbonate (OS-CAL) 1250 (500 Ca) MG chewable tablet Chew by mouth. 12/28/21 02/26/22  [provider]  etonogestrel  (NEXPLANON ) 68 MG IMPL implant 1 each by Subdermal route once. 02/04/21   [provider]  gabapentin (NEURONTIN) 100 MG capsule Take 100 mg by mouth 3 (three) times daily. 12/28/21   [provider]  gabapentin (NEURONTIN) 300 MG capsule Take  300 mg by mouth at bedtime as needed. 02/01/22   [provider]  metroNIDAZOLE  (FLAGYL ) 500 MG tablet Take 1 tablet (500 mg total) by mouth 2 (two) times daily for 7 days. 11/06/23 11/13/23  Idol, Janet L, NP  ondansetron  (ZOFRAN -ODT) 4 MG disintegrating tablet Take 1 tablet (4 mg total) by mouth every 8 (eight) hours as needed for nausea or vomiting. 03/01/23   Paduchowski, Kevin, MD  sertraline (ZOLOFT) 100 MG tablet Take 150 mg by mouth daily. 11/17/21   [provider]  traMADol  (ULTRAM ) 50 MG tablet Take by mouth. 02/01/22   [provider]    Family History Family History  Problem Relation Age of Onset   Breast cancer Maternal Grandmother    Other Maternal Grandfather         benign brain tumor   Diabetes Maternal Grandfather    Other Mother        benign brain tumor   Diabetes Mother    Migraines Mother    Asthma Brother    Asthma Daughter     Social History Social History   Tobacco Use   Smoking status: Every Day    Current packs/day: 0.25    Types: Cigarettes, E-cigarettes, Cigars   Smokeless tobacco: Never  Vaping Use   Vaping status: Some Days   Substances: Nicotine, CBD, Flavoring  Substance Use Topics   Alcohol use: Not Currently    Alcohol/week: 4.0 standard drinks of alcohol    Types: 4 Shots of liquor per week    Comment: last use 11/11/21 quit because was daily   Drug use: Yes    Types: Marijuana     Allergies   Peanut-containing drug products and Watermelon [citrullus vulgaris]   Review of Systems Review of Systems  Constitutional:  Negative for fatigue and fever.  HENT:  Negative for congestion, rhinorrhea and trouble swallowing.   Respiratory:  Negative for cough and shortness of breath.   Cardiovascular:  Negative for chest pain.  Gastrointestinal:  Positive for abdominal pain. Negative for nausea and vomiting.  Musculoskeletal:  Positive for arthralgias. Negative for back pain, gait problem, joint swelling, neck pain and neck stiffness.  Neurological:  Positive for dizziness, weakness and numbness. Negative for tremors, seizures, syncope, facial asymmetry, speech difficulty and headaches.  Psychiatric/Behavioral:  Negative for confusion.      Physical Exam Triage Vital Signs ED Triage Vitals  Encounter Vitals Group     BP 11/12/23 0921 (!) 139/97     Girls Systolic BP Percentile --      Girls Diastolic BP Percentile --      Boys Systolic BP Percentile --      Boys Diastolic BP Percentile --      Pulse Rate 11/12/23 0921 77     Resp 11/12/23 0921 16     Temp 11/12/23 0921 98.6 F (37 C)     Temp Source 11/12/23 0921 Oral     SpO2 11/12/23 0921 97 %     Weight --      Height --      Head Circumference --       Peak Flow --      Pain Score 11/12/23 0924 7     Pain Loc --      Pain Education --      Exclude from Growth Chart --    No data found.  Updated Vital Signs BP (!) 139/97 (BP Location: Left Arm)   Pulse 77   Temp 98.6  F (37 C) (Oral)   Resp 16   LMP 10/16/2023 (Approximate) Comment: Last period a few weeks ago. Reports period sometimes with implant.  SpO2 97%   Physical Exam Vitals and nursing note reviewed.  Constitutional:      General: She is not in acute distress.    Appearance: Normal appearance. She is not ill-appearing or toxic-appearing.  HENT:     Head: Normocephalic and atraumatic.     Nose: Nose normal.     Mouth/Throat:     Mouth: Mucous membranes are moist.     Pharynx: Oropharynx is clear.     Comments: Normal movement of tongue  Eyes:     General: No scleral icterus.       Right eye: No discharge.        Left eye: No discharge.     Extraocular Movements: Extraocular movements intact.     Conjunctiva/sclera: Conjunctivae normal.     Pupils: Pupils are equal, round, and reactive to light.    Cardiovascular:     Rate and Rhythm: Normal rate and regular rhythm.     Heart sounds: Normal heart sounds.  Pulmonary:     Effort: Pulmonary effort is normal. No respiratory distress.     Breath sounds: Normal breath sounds.  Abdominal:     Palpations: Abdomen is soft.     Tenderness: There is abdominal tenderness (generalized).   Musculoskeletal:     Cervical back: Neck supple.   Skin:    General: Skin is dry.   Neurological:     General: No focal deficit present.     Mental Status: She is alert and oriented to person, place, and time. Mental status is at baseline.     Cranial Nerves: No cranial nerve deficit.     Motor: No weakness.     Coordination: Coordination normal.     Gait: Gait normal.     Comments: 5/5 strength bilateral upper and lower extremities  Psychiatric:        Mood and Affect: Mood normal.        Behavior: Behavior normal.       UC Treatments / Results  Labs (all labs ordered are listed, but only abnormal results are displayed) Labs Reviewed - No data to display  EKG   Radiology No results found.  Procedures Procedures (including critical care time)  Medications Ordered in UC Medications - No data to display  Initial Impression / Assessment and Plan / UC Course  I have reviewed the triage vital signs and the nursing notes.  Pertinent labs & imaging results that were available during my care of the patient were reviewed by me and considered in my medical decision making (see chart for details).  33 y/o female presents for 10 day history of lower extremity and abdominal numbness/tingling. Over the past 3 days has had numbness of tongue and mouth, loss of temperature sensation orally, dizziness, and weakness of legs.   Vitals are stable. Normal neuro exam. Generalized abdominal tenderness.  Unknown illness with uncertain prognosis requiring higher level of care at this time.   DDX: Willey Harrier, MS, electrolyte derangement, hormonal imbalance, thyroid issue, brain tumor or other intracranial abnormality, endocrine condition, GI infection, hepatitis.  Advised ED for more comprehensive workup. Patient is agreeable to plan and will proceed to [facility] ED [facility]. Declines EMS. Leaving in stable condition.   Final Clinical Impressions(s) / UC Diagnoses   Final diagnoses:  Paresthesias  Numbness of tongue  Dizziness  Generalized  abdominal pain  Family history of brain tumor     Discharge Instructions      You have been advised to follow up immediately in the emergency department for concerning signs.symptoms. If you declined EMS transport, please have a family member take you directly to the ED at this time. Do not delay. Based on concerns about condition, if you do not follow up in th e ED, you may risk poor outcomes including worsening of condition, delayed treatment and potentially life  threatening issues. If you have declined to go to the ED at this time, you should call your PCP immediately to set up a follow up appointment.  Go to ED for red flag symptoms, including; fevers you cannot reduce with Tylenol /Motrin , severe headaches, vision changes, numbness/weakness in part of the body, lethargy, confusion, intractable vomiting, severe dehydration, chest pain, breathing difficulty, severe persistent abdominal or pelvic pain, signs of severe infection (increased redness, swelling of an area), feeling faint or passing out, dizziness, etc. You should especially go to the ED for sudden acute worsening of condition if you do not elect to go at this time.      ED Prescriptions   None    PDMP not reviewed this encounter.   Floydene Hy, PA-C 11/13/23 [phone]

## 2023-11-12 NOTE — Discharge Instructions (Signed)

## 2023-11-12 NOTE — Progress Notes (Signed)
 Wet Prep negative in office date of service.  GC pharyngeal culture negative.  No further evaluation, diagnostic testing, or treatment indicated at this time.  Kimberly Provencal L. Jasara Corrigan, FNP-C

## 2023-11-12 NOTE — ED Notes (Signed)
 Patient is being discharged from the Urgent Care and sent to the Emergency Department via POV . Per Mayford Speaks, [place], patient is in need of higher level of care due to numbness and dizziness. Patient is aware and verbalizes understanding of plan of care.  Vitals:   11/12/23 0921  BP: (!) 139/97  Pulse: 77  Resp: 16  Temp: 98.6 F (37 C)  SpO2: 97%

## 2023-11-16 ENCOUNTER — Telehealth: Payer: Self-pay

## 2023-11-16 NOTE — Telephone Encounter (Signed)
 Phone call to pt at [phone]. Pt answered and confirmed password. Counseled pt re + GC result and need for tx.  Tx appt scheduled for 11/19/23 AM per pt request.

## 2023-11-16 NOTE — Telephone Encounter (Signed)
 Call pt re positive gonorrhea result from 11/06/23 vaginal specimen. Needs tx.

## 2023-11-19 ENCOUNTER — Ambulatory Visit

## 2023-11-19 DIAGNOSIS — A549 Gonococcal infection, unspecified: Secondary | ICD-10-CM

## 2023-11-19 DIAGNOSIS — Z113 Encounter for screening for infections with a predominantly sexual mode of transmission: Secondary | ICD-10-CM | POA: Diagnosis not present

## 2023-11-19 MED ORDER — CEFTRIAXONE SODIUM 500 MG IJ SOLR
500.0000 mg | Freq: Once | INTRAMUSCULAR | Status: AC
  Administered 2023-11-19: 500 mg via INTRAMUSCULAR

## 2023-11-19 NOTE — Progress Notes (Signed)
 Pt is here for the treatment of gonorrhea.  Treated per SO, tolerated well and questions answered.  Condoms declined. Larraine JONELLE [place], RN

## 2023-11-20 ENCOUNTER — Encounter: Payer: Self-pay | Admitting: Nurse Practitioner

## 2023-12-11 ENCOUNTER — Ambulatory Visit

## 2024-01-11 ENCOUNTER — Ambulatory Visit
Admission: EM | Admit: 2024-01-11 | Discharge: 2024-01-11 | Disposition: A | Discharge status: Home / Self Care | Attending: Physician Assistant | Admitting: Physician Assistant

## 2024-01-11 DIAGNOSIS — N921 Excessive and frequent menstruation with irregular cycle: Secondary | ICD-10-CM | POA: Insufficient documentation

## 2024-01-11 DIAGNOSIS — R102 Pelvic and perineal pain: Secondary | ICD-10-CM | POA: Insufficient documentation

## 2024-01-11 DIAGNOSIS — Z113 Encounter for screening for infections with a predominantly sexual mode of transmission: Secondary | ICD-10-CM | POA: Insufficient documentation

## 2024-01-11 LAB — CBC WITH DIFFERENTIAL/PLATELET
Abs Immature Granulocytes: 0.02 K/uL (ref 0.00–0.07)
Basophils Absolute: 0 K/uL (ref 0.0–0.1)
Basophils Relative: 0 %
Eosinophils Absolute: 0 K/uL (ref 0.0–0.5)
Eosinophils Relative: 0 %
HCT: 36.7 % (ref 36.0–46.0)
Hemoglobin: 12.7 g/dL (ref 12.0–15.0)
Immature Granulocytes: 0 %
Lymphocytes Relative: 41 %
Lymphs Abs: 2.8 K/uL (ref 0.7–4.0)
MCH: 31.1 pg (ref 26.0–34.0)
MCHC: 34.6 g/dL (ref 30.0–36.0)
MCV: 89.7 fL (ref 80.0–100.0)
Monocytes Absolute: 0.5 K/uL (ref 0.1–1.0)
Monocytes Relative: 7 %
Neutro Abs: 3.5 K/uL (ref 1.7–7.7)
Neutrophils Relative %: 52 %
Platelets: 423 K/uL — ABNORMAL HIGH (ref 150–400)
RBC: 4.09 MIL/uL (ref 3.87–5.11)
RDW: 15.4 % (ref 11.5–15.5)
WBC: 6.9 K/uL (ref 4.0–10.5)
nRBC: 0 % (ref 0.0–0.2)

## 2024-01-11 LAB — PREGNANCY, URINE: Preg Test, Ur: NEGATIVE

## 2024-01-11 LAB — HIV ANTIBODY (ROUTINE TESTING W REFLEX): HIV Screen 4th Generation wRfx: NONREACTIVE

## 2024-01-11 NOTE — ED Provider Notes (Signed)
 [facility]    CSN: [id number] Arrival date & time: 01/11/24  1101      History   Chief Complaint Chief Complaint  Patient presents with   Menorrhagia    HPI Kimberly Hansen is a 33 y.o. female presenting for heavy vaginal bleeding x 2-3 weeks. Patient says she has had a Nexplanon  x 11 years. The most recent one was put in 2 years ago. She reports having 3-4 periods per year and has never had one last this long. She states she has changed her pad twice in the last 8-9 hours. Has had mild generalized lower abdominal cramping. No vaginal discharge or odor. She would like STI screening. Has had a new partner in the past 2 months. She was treated for gonorrhea 2 months ago.   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   Right upper quadrant abdominal pain 09/01/2023   Anxiety dx'd 2023 02/09/2022   Hypertension dx'd 2023 02/09/2022   Smoker 2-3 cpd 02/09/2022   Closed displaced fracture of lateral malleolus of left fibula 12/21/2021   Abnormal Pap smear of cervix 01/12/21 ASCUS HPV+ 02/04/2021   Overweight BMI=29.5 02/04/2021    Past Surgical History:  Procedure Laterality Date   DRUG INDUCED ENDOSCOPY     for hernia   ORTHOPEDIC SURGERY Left    tibial fracture    OB History     Gravida  2   Para  2   Term  1   Preterm  1   AB  0   Living  2      SAB  0   IAB  0   Ectopic  0   Multiple  0   Live Births  2            Home Medications    Prior to Admission medications   Medication Sig Start Date End Date Taking? Authorizing Provider  sucralfate  (CARAFATE ) 1 GM/10ML  suspension Take 1 g by mouth. 09/20/23  Yes [provider]  alum & mag hydroxide-simeth (MAALOX MULTI SYMPTOM MAX ST) 400-400-40 MG/5ML suspension Take 10 mLs by mouth every 6 (six) hours as needed for indigestion. 03/01/23   Dorothyann Drivers, MD  amLODipine  (NORVASC ) 5 MG tablet Take 5 mg by mouth daily. 11/17/21   [provider]  calcium carbonate (OS-CAL) 1250 (500 Ca) MG chewable tablet Chew by mouth. 12/28/21 02/26/22  [provider]  etonogestrel  (NEXPLANON ) 68 MG IMPL implant 1 each by Subdermal route once. 02/04/21   [provider]  gabapentin (NEURONTIN) 100 MG capsule Take 100 mg by mouth 3 (three) times daily. 12/28/21   [provider]  gabapentin (NEURONTIN) 300 MG capsule Take 300 mg by mouth at bedtime as needed. 02/01/22   [provider]  losartan (COZAAR) 50 MG tablet Take 1 tablet by mouth daily.    [provider]  losartan-hydrochlorothiazide (HYZAAR) 50-12.5 MG tablet     [provider]  ondansetron  (ZOFRAN -ODT) 4 MG disintegrating tablet Take 1 tablet (4 mg total) by mouth every 8 (eight) hours as needed for nausea or vomiting. 03/01/23  Paduchowski, Kevin, MD  sertraline (ZOLOFT) 100 MG tablet Take 150 mg by mouth daily. 11/17/21   [provider]  traMADol  (ULTRAM ) 50 MG tablet Take by mouth. 02/01/22   [provider]    Family History Family History  Problem Relation Age of Onset   Breast cancer Maternal Grandmother    Other Maternal Grandfather        benign brain tumor   Diabetes Maternal Grandfather    Other Mother        benign brain tumor   Diabetes Mother    Migraines Mother    Asthma Brother    Asthma Daughter     Social History Social History   Tobacco Use   Smoking status: Every Day    Current packs/day: 0.25    Types: Cigarettes, E-cigarettes, Cigars   Smokeless tobacco: Never  Vaping Use   Vaping status: Some Days   Substances: Nicotine, CBD, Flavoring   Substance Use Topics   Alcohol use: Not Currently    Alcohol/week: 4.0 standard drinks of alcohol    Types: 4 Shots of liquor per week    Comment: last use 11/11/21 quit because was daily   Drug use: Yes    Types: Marijuana     Allergies   Peanut-containing drug products and Watermelon [citrullus vulgaris]   Review of Systems Review of Systems  Constitutional:  Negative for fatigue.  Gastrointestinal:  Positive for abdominal pain.  Genitourinary:  Positive for menstrual problem, pelvic pain and vaginal bleeding. Negative for difficulty urinating, dysuria, frequency and vaginal discharge.  Neurological:  Negative for dizziness, weakness and headaches.     Physical Exam Triage Vital Signs ED Triage Vitals  Encounter Vitals Group     BP 01/11/24 1116 (!) 136/97     Girls Systolic BP Percentile --      Girls Diastolic BP Percentile --      Boys Systolic BP Percentile --      Boys Diastolic BP Percentile --      Pulse Rate 01/11/24 1116 (!) 111     Resp 01/11/24 1116 19     Temp 01/11/24 1116 98 F (36.7 C)     Temp Source 01/11/24 1116 Oral     SpO2 01/11/24 1116 95 %     Weight 01/11/24 1114 154 lb (69.9 kg)     Height --      Head Circumference --      Peak Flow --      Pain Score 01/11/24 1114 0     Pain Loc --      Pain Education --      Exclude from Growth Chart --    No data found.  Updated Vital Signs BP (!) 136/97 (BP Location: Right Arm)   Pulse (!) 111   Temp 98 F (36.7 C) (Oral)   Resp 19   Wt 131 lb (59.4 kg)   LMP 12/17/2023 (Exact Date)   SpO2 95%   BMI 23.96 kg/m     Physical Exam Vitals and nursing note reviewed.  Constitutional:      General: She is not in acute distress.    Appearance: Normal appearance. She is not ill-appearing or toxic-appearing.  HENT:     Head: Normocephalic and atraumatic.     Nose: Nose normal.     Mouth/Throat:     Mouth: Mucous membranes are moist.     Pharynx: Oropharynx is clear.  Eyes:     General:  No scleral icterus.  Right eye: No discharge.        Left eye: No discharge.     Conjunctiva/sclera: Conjunctivae normal.  Cardiovascular:     Rate and Rhythm: Regular rhythm. Tachycardia present.     Heart sounds: Normal heart sounds.  Pulmonary:     Effort: Pulmonary effort is normal. No respiratory distress.     Breath sounds: Normal breath sounds.  Abdominal:     Palpations: Abdomen is soft.     Tenderness: There is abdominal tenderness (generalized lower).  Musculoskeletal:     Cervical back: Neck supple.  Skin:    General: Skin is dry.  Neurological:     General: No focal deficit present.     Mental Status: She is alert. Mental status is at baseline.     Motor: No weakness.     Gait: Gait normal.  Psychiatric:        Mood and Affect: Mood normal.        Behavior: Behavior normal.      UC Treatments / Results  Labs (all labs ordered are listed, but only abnormal results are displayed) Labs Reviewed  CBC WITH DIFFERENTIAL/PLATELET - Abnormal; Notable for the following components:      Result Value   Platelets 423 (*)    All other components within normal limits  PREGNANCY, URINE  HIV ANTIBODY (ROUTINE TESTING W REFLEX)  RPR  CERVICOVAGINAL ANCILLARY ONLY    EKG   Radiology No results found.  Procedures Procedures (including critical care time)  Medications Ordered in UC Medications - No data to display  Initial Impression / Assessment and Plan / UC Course  I have reviewed the triage vital signs and the nursing notes.  Pertinent labs & imaging results that were available during my care of the patient were reviewed by me and considered in my medical decision making (see chart for details).   33 y/o female presents for vaginal bleeding x 2-3 weeks. Has a Nexplanon . Has had mild associated pelvic cramping. Requests STI screening. History of gonorrhea 2 months ago. Reviewed previous notes and labs from Infectious Disease.  Patient is overall well  appearing. Mild tachycardia. Mild generalized lower abdominal cramping.   Urine pregnancy negative.   CBC WNL  Pending GC/chlam/trich, HIV and RPR testing. Will treat if positive results.  Advised making follow up appointment with OB/GYN. Abnormal bleeding likely due to Nexplanon . Patient should discuss having this removed and consideration of another form of birth control. She says she is due for a pap smear also. Reviewed return and ED precautions.   Final Clinical Impressions(s) / UC Diagnoses   Final diagnoses:  Menorrhagia with irregular cycle  Routine screening for STI (sexually transmitted infection)  Pelvic cramping     Discharge Instructions      -Negative pregnancy test -Heavy period is due to nexplanon   -Make follow up appointment with OB/GYN -We will call with any abnormal results -Wear tampons and pads -If increased fatigue, dizziness, pelvic pain, fainting go to ER    ED Prescriptions   None    PDMP not reviewed this encounter.   Arvis Jolan NOVAK, PA-C 01/11/24 1311

## 2024-01-11 NOTE — Discharge Instructions (Signed)
-  Negative pregnancy test -Heavy period is due to nexplanon   -Make follow up appointment with OB/GYN -We will call with any abnormal results -Wear tampons and pads -If increased fatigue, dizziness, pelvic pain, fainting go to ER

## 2024-01-11 NOTE — ED Triage Notes (Addendum)
 Patient states that she has a nexplanon  that she's had for 10 yrs. The current one has been in for 2 yrs. Patient states that she's has a period since 7-21. Patient states that she thought it was going off Tuesday night and woke up really heavy Wednesday morning. Patient also wants to be tested for STD's swab and blood work.

## 2024-01-12 LAB — RPR: RPR Ser Ql: NONREACTIVE

## 2024-01-14 LAB — CERVICOVAGINAL ANCILLARY ONLY
Chlamydia: NEGATIVE
Comment: NEGATIVE
Comment: NEGATIVE
Comment: NORMAL
Neisseria Gonorrhea: NEGATIVE
Trichomonas: NEGATIVE

## 2024-01-29 ENCOUNTER — Encounter (INDEPENDENT_AMBULATORY_CARE_PROVIDER_SITE_OTHER): Payer: Self-pay | Admitting: Vascular Surgery

## 2024-01-29 ENCOUNTER — Ambulatory Visit (INDEPENDENT_AMBULATORY_CARE_PROVIDER_SITE_OTHER): Admitting: Vascular Surgery

## 2024-01-29 VITALS — BP 149/103 | HR 64 | Ht 62.0 in | Wt 141.0 lb

## 2024-01-29 DIAGNOSIS — I10 Essential (primary) hypertension: Secondary | ICD-10-CM | POA: Diagnosis not present

## 2024-01-29 DIAGNOSIS — I871 Compression of vein: Secondary | ICD-10-CM | POA: Diagnosis not present

## 2024-01-29 NOTE — Progress Notes (Signed)
 Patient ID: Kimberly Hansen, female   DOB: [birth year], 33 y.o.   MRN: [id number]  Chief Complaint  Patient presents with   New Patient (Initial Visit)     - np. consult. nutcracker phenomenon L renal vein. CTA in referral. [place], Kimberly Hansen.     HPI Kimberly Hansen is a 33 y.o. female.  I am asked to see the patient by Dr. 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  She did not have hematuria and her pain was fairly vague and not clearly related to the nutcracker syndrome.  She was seen by a variety of providers at that time including a vascular surgeon from [facility] who did not recommend any intervention.  She still has intermittent upper abdominal pain.  She has no hematuria or light urologic symptoms at this time.  She had an upper endoscopy earlier this year which I have been unable to find the results of and the patient is unaware of the results.  She has not had unintentional weight loss.  I do not have ability to view her images from [facility] at this time, but she had multiple CT scans and I have reviewed the results.   Past Medical History:  Diagnosis Date   Hiatal hernia    Hypertension    Ovarian cyst     Past Surgical History:  Procedure Laterality Date   DRUG INDUCED ENDOSCOPY     for hernia   ORTHOPEDIC SURGERY Left    tibial fracture     Family History  Problem Relation Age of Onset   Breast cancer Maternal Grandmother    Other Maternal Grandfather        benign brain tumor   Diabetes Maternal Grandfather    Other Mother        benign brain tumor   Diabetes Mother    Migraines Mother    Asthma Brother    Asthma Daughter       Social History   Tobacco Use   Smoking status: Every Day    Current packs/day: 0.25    Types: Cigarettes, E-cigarettes,  Cigars   Smokeless tobacco: Never  Vaping Use   Vaping status: Some Days   Substances: Nicotine, CBD, Flavoring  Substance Use Topics   Alcohol use: Not Currently    Alcohol/week: 4.0 standard drinks of alcohol    Types: 4 Shots of liquor per week    Comment: last use 11/11/21 quit because was daily   Drug use: Yes    Types: Marijuana     Allergies  Allergen Reactions   Peanut-Containing Drug Products Anaphylaxis   Watermelon [Citrullus Vulgaris] Hives and Swelling    Most melons, peaches, nectorines.  Has hives and can have lip swelling.    Current Outpatient Medications  Medication Sig Dispense Refill   alum & mag hydroxide-simeth (MAALOX MULTI SYMPTOM MAX ST) 400-400-40 MG/5ML suspension Take 10 mLs by mouth every 6 (six) hours as needed for indigestion. 355 mL 0   amLODipine  (NORVASC ) 5 MG tablet Take 5 mg by mouth daily.     calcium carbonate (OS-CAL) 1250 (500 Ca) MG chewable tablet Chew by mouth.     etonogestrel  (NEXPLANON ) 68 MG IMPL implant 1 each by Subdermal route once.     losartan (COZAAR) 50 MG tablet Take 1  tablet by mouth daily.     losartan-hydrochlorothiazide (HYZAAR) 50-12.5 MG tablet      ondansetron  (ZOFRAN -ODT) 4 MG disintegrating tablet Take 1 tablet (4 mg total) by mouth every 8 (eight) hours as needed for nausea or vomiting. 20 tablet 0   sertraline (ZOLOFT) 100 MG tablet Take 150 mg by mouth daily.     sucralfate  (CARAFATE ) 1 GM/10ML suspension Take 1 g by mouth.     gabapentin (NEURONTIN) 100 MG capsule Take 100 mg by mouth 3 (three) times daily. (Patient not taking: Reported on 01/29/2024)     gabapentin (NEURONTIN) 300 MG capsule Take 300 mg by mouth at bedtime as needed. (Patient not taking: Reported on 01/29/2024)     traMADol  (ULTRAM ) 50 MG tablet Take by mouth. (Patient not taking: Reported on 01/29/2024)     No current facility-administered medications for this visit.      REVIEW OF SYSTEMS (Negative unless checked)  Constitutional: [] Weight  loss  [] Fever  [] Chills Cardiac: [] Chest pain   [] Chest pressure   [] Palpitations   [] Shortness of breath when laying flat   [] Shortness of breath at rest   [] Shortness of breath with exertion. Vascular:  [] Pain in legs with walking   [] Pain in legs at rest   [] Pain in legs when laying flat   [] Claudication   [] Pain in feet when walking  [] Pain in feet at rest  [] Pain in feet when laying flat   [] History of DVT   [] Phlebitis   [] Swelling in legs   [] Varicose veins   [] Non-healing ulcers Pulmonary:   [] Uses home oxygen   [] Productive cough   [] Hemoptysis   [] Wheeze  [] COPD   [] Asthma Neurologic:  [] Dizziness  [] Blackouts   [] Seizures   [] History of stroke   [] History of TIA  [] Aphasia   [] Temporary blindness   [] Dysphagia   [] Weakness or numbness in arms   [] Weakness or numbness in legs Musculoskeletal:  [] Arthritis   [] Joint swelling   [] Joint pain   [] Low back pain Hematologic:  [] Easy bruising  [] Easy bleeding   [] Hypercoagulable state   [] Anemic  [] Hepatitis Gastrointestinal:  [] Blood in stool   [] Vomiting blood  [] Gastroesophageal reflux/heartburn   [x] Abdominal pain Genitourinary:  [] Chronic kidney disease   [] Difficult urination  [] Frequent urination  [] Burning with urination   [] Hematuria Skin:  [] Rashes   [] Ulcers   [] Wounds Psychological:  [] History of anxiety   []  History of major depression.    Physical Exam BP (!) 149/103   Pulse 64   Ht 5' 2 (1.575 m)   Wt 141 lb (64 kg)   LMP 12/17/2023 (Exact Date)   BMI 25.79 kg/m  Gen:  WD/WN, NAD Head: [place]/AT, No temporalis wasting.  Ear/Nose/Throat: Hearing grossly intact, nares w/o erythema or drainage, oropharynx w/o Erythema/Exudate Eyes: Conjunctiva clear, sclera non-icteric  Neck: trachea midline.  No JVD.  Pulmonary:  Good air movement, respirations not labored, no use of accessory muscles  Cardiac: RRR, no JVD Vascular:  Vessel Right Left  Radial Palpable Palpable                                   Gastrointestinal:.  No masses, surgical incisions, or scars. Musculoskeletal: M/S 5/5 throughout.  Extremities without ischemic changes.  No deformity or atrophy. No edema. Neurologic: Sensation grossly intact in extremities.  Symmetrical.  Speech is fluent. Motor exam as listed above. Psychiatric: Judgment intact, Mood & affect appropriate for  pt's clinical situation. Dermatologic: No rashes or ulcers noted.  No cellulitis or open wounds.    Radiology No results found.  Labs Recent Results (from the past 2160 hours)  Gonococcus culture     Status: None   Collection Time: 11/06/23 12:00 AM   Specimen: Pharynx; Throat   Throat  Result Value Ref Range   GC Culture Only Final report    Result 1 Comment     Comment: No Neisseria gonorrhoeae isolated.  HBV Antigen/Antibody State Lab     Status: None   Collection Time: 11/06/23 12:00 AM  Result Value Ref Range   Hepatitis B Surface Antigen Non Reactive    Hep B S Ab Non Reactive    Hep B Core Total Ab Non Reactive   Specimen status report     Status: None   Collection Time: 11/06/23 12:00 AM  Result Value Ref Range   specimen status report Comment     Comment: Please note Please note The date and/or time of collection was not indicated on the requisition as required by state and federal law.  The date of receipt of the specimen was used as the collection date if not supplied.   HM HIV SCREENING LAB     Status: None   Collection Time: 11/06/23 12:00 AM  Result Value Ref Range   HM HIV Screening Negative - Validated   HM HEPATITIS C SCREENING LAB     Status: None   Collection Time: 11/06/23 12:00 AM  Result Value Ref Range   HM Hepatitis Screen Negative-Validated   WET PREP FOR TRICH, YEAST, CLUE     Status: None   Collection Time: 11/06/23  5:42 PM  Result Value Ref Range   Trichomonas Exam Negative Negative   Yeast Exam Negative Negative   Clue Cell Exam Negative Negative    Comment: AMINE: NEGATIVE  Pregnancy, urine     Status: None    Collection Time: 01/11/24 11:19 AM  Result Value Ref Range   Preg Test, Ur NEGATIVE NEGATIVE    Comment:        THE SENSITIVITY OF THIS METHODOLOGY IS >20 mIU/mL. Performed at [facility], [address] [place], [place] [address]   Cervicovaginal ancillary only     Status: None   Collection Time: 01/11/24 11:19 AM  Result Value Ref Range   Neisseria Gonorrhea Negative    Chlamydia Negative    Trichomonas Negative    Comment Normal Reference Range Trichomonas - Negative    Comment Normal Reference [place] Chlamydia - Negative    Comment      Normal Reference Range Neisseria Gonorrhea - Negative  HIV Antibody (routine testing w rflx)     Status: None   Collection Time: 01/11/24 11:30 AM  Result Value Ref Range   HIV Screen 4th Generation wRfx Non Reactive Non Reactive    Comment: Performed at [facility], [address], [place], [place] [address]  RPR     Status: None   Collection Time: 01/11/24 11:30 AM  Result Value Ref Range   RPR Ser Ql NON REACTIVE NON REACTIVE    Comment: Performed at [facility], [address], [place], [place] [address]  CBC with Differential     Status: Abnormal   Collection Time: 01/11/24 11:30 AM  Result Value Ref Range   WBC 6.9 4.0 - 10.5 K/uL   RBC 4.09 3.87 - 5.11 MIL/uL   Hemoglobin 12.7 12.0 - 15.0  g/dL   HCT 63.2 63.9 - 53.9 %   MCV 89.7 80.0 - 100.0 fL   MCH 31.1 26.0 - 34.0 pg   MCHC 34.6 30.0 - 36.0 g/dL   RDW 84.5 88.4 - 84.4 %   Platelets 423 (H) 150 - 400 K/uL   nRBC 0.0 0.0 - 0.2 %   Neutrophils Relative % 52 %   Neutro Abs 3.5 1.7 - 7.7 K/uL   Lymphocytes Relative 41 %   Lymphs Abs 2.8 0.7 - 4.0 K/uL   Monocytes Relative 7 %   Monocytes Absolute 0.5 0.1 - 1.0 K/uL   Eosinophils Relative 0 %   Eosinophils Absolute 0.0 0.0 - 0.5 K/uL   Basophils Relative 0 %   Basophils Absolute 0.0 0.0 - 0.1 K/uL   Immature Granulocytes 0 %   Abs Immature Granulocytes 0.02 0.00 - 0.07 K/uL    Comment: Performed  at [facility], 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  I had a long discussion today with the patient regarding the pathophysiology and reason that nutcracker syndrome can cause symptoms.  It is not clear to me that any of her symptoms are actually related to the nutcracker syndrome at this time.  It is possible, but also discussed that that is less likely to be the cause of her symptoms than many other things.  I have also discussed the treatment of this with a left renal vein transposition would be a very large open abdominal surgery that could create more harm than good even with clearly symptomatic patients.  I would not recommend any surgery or intervention at this time.  I will plan to see her back in a few months to reassess.  I also recommended she follow-up again with her GI doctor to discuss possible causes of her abdominal pain.  Hypertension dx'd 2023 blood pressure control important in reducing the progression of atherosclerotic disease. On appropriate oral medications.      Selinda Gu 01/29/2024, 12:42 PM   This note was created with Dragon medical transcription system.  Any errors from dictation are unintentional.

## 2024-01-29 NOTE — Assessment & Plan Note (Signed)
 blood pressure control important in reducing the progression of atherosclerotic disease. On appropriate oral medications.

## 2024-01-29 NOTE — Assessment & Plan Note (Signed)
 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  I had a long discussion today with the patient regarding the pathophysiology and reason that nutcracker syndrome can cause symptoms.  It is not clear to me that any of her symptoms are actually related to the nutcracker syndrome at this time.  It is possible, but also discussed that that is less likely to be the cause of her symptoms than many other things.  I have also discussed the treatment of this with a left renal vein transposition would be a very large open abdominal surgery that could create more harm than good even with clearly symptomatic patients.  I would not recommend any surgery or intervention at this time.  I will plan to see her back in a few months to reassess.  I also recommended she follow-up again with her GI doctor to discuss possible causes of her abdominal pain.

## 2024-03-28 ENCOUNTER — Emergency Department: Payer: MEDICAID

## 2024-03-28 ENCOUNTER — Encounter: Payer: Self-pay | Admitting: Emergency Medicine

## 2024-03-28 ENCOUNTER — Emergency Department
Admission: EM | Admit: 2024-03-28 | Discharge: 2024-03-28 | Payer: MEDICAID | Attending: Emergency Medicine | Admitting: Emergency Medicine

## 2024-03-28 DIAGNOSIS — S82831A Other fracture of upper and lower end of right fibula, initial encounter for closed fracture: Secondary | ICD-10-CM | POA: Insufficient documentation

## 2024-03-28 DIAGNOSIS — M79671 Pain in right foot: Secondary | ICD-10-CM | POA: Diagnosis present

## 2024-03-28 DIAGNOSIS — S82839A Other fracture of upper and lower end of unspecified fibula, initial encounter for closed fracture: Secondary | ICD-10-CM

## 2024-03-28 DIAGNOSIS — S92901A Unspecified fracture of right foot, initial encounter for closed fracture: Secondary | ICD-10-CM | POA: Diagnosis not present

## 2024-03-28 DIAGNOSIS — Y92410 Unspecified street and highway as the place of occurrence of the external cause: Secondary | ICD-10-CM | POA: Insufficient documentation

## 2024-03-28 DIAGNOSIS — S99911A Unspecified injury of right ankle, initial encounter: Secondary | ICD-10-CM

## 2024-03-28 DIAGNOSIS — Z008 Encounter for other general examination: Secondary | ICD-10-CM

## 2024-03-28 MED ORDER — OXYCODONE-ACETAMINOPHEN 5-325 MG PO TABS
2.0000 | ORAL_TABLET | Freq: Once | ORAL | Status: AC
  Administered 2024-03-28: 2 via ORAL
  Filled 2024-03-28: qty 2
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
## 2024-03-28 NOTE — Discharge Instructions (Signed)
 As we discussed, your evaluation was reassuring except for your right foot.  You have multiple injuries to your foot and ankle, and it is important that you call the office of Dr. Ashley to schedule follow-up appointment with him or one of his podiatry colleagues at the next available opportunity.  In the meantime, please use the boot you were provided and use crutches is much as possible so that you remain nonweightbearing on the right foot.  Keep it elevated when possible and you can use cold packs on the boot as well.  You can take ibuprofen  600 mg 3 times a day with meals as well as acetaminophen  1000 mg every 6 hours for the pain.  Remember, your foot and ankle may not heal correctly if you do not follow-up with the specialist, so please call them to schedule a follow-up appointment when you have the opportunity to do so.

## 2024-04-27 ENCOUNTER — Emergency Department: Payer: MEDICAID

## 2024-04-27 ENCOUNTER — Encounter: Payer: Self-pay | Admitting: Emergency Medicine

## 2024-04-27 ENCOUNTER — Other Ambulatory Visit: Payer: Self-pay

## 2024-04-27 ENCOUNTER — Emergency Department
Admission: EM | Admit: 2024-04-27 | Discharge: 2024-04-28 | Disposition: A | Payer: MEDICAID | Attending: Emergency Medicine | Admitting: Emergency Medicine

## 2024-04-27 DIAGNOSIS — R0789 Other chest pain: Secondary | ICD-10-CM

## 2024-04-27 DIAGNOSIS — K449 Diaphragmatic hernia without obstruction or gangrene: Secondary | ICD-10-CM

## 2024-04-27 DIAGNOSIS — R0602 Shortness of breath: Secondary | ICD-10-CM | POA: Diagnosis present

## 2024-04-27 DIAGNOSIS — R000 Tachycardia, unspecified: Secondary | ICD-10-CM | POA: Insufficient documentation

## 2024-04-27 DIAGNOSIS — M7989 Other specified soft tissue disorders: Secondary | ICD-10-CM | POA: Diagnosis not present

## 2024-04-27 DIAGNOSIS — R7989 Other specified abnormal findings of blood chemistry: Secondary | ICD-10-CM

## 2024-04-27 DIAGNOSIS — M79671 Pain in right foot: Secondary | ICD-10-CM | POA: Insufficient documentation

## 2024-04-27 DIAGNOSIS — R946 Abnormal results of thyroid function studies: Secondary | ICD-10-CM | POA: Diagnosis not present

## 2024-04-27 HISTORY — DX: Diaphragmatic hernia without obstruction or gangrene: K44.9

## 2024-04-27 LAB — CBC
HCT: 43.7 % (ref 36.0–46.0)
Hemoglobin: 14.7 g/dL (ref 12.0–15.0)
MCH: 30.2 pg (ref 26.0–34.0)
MCHC: 33.6 g/dL (ref 30.0–36.0)
MCV: 89.7 fL (ref 80.0–100.0)
Platelets: 374 K/uL (ref 150–400)
RBC: 4.87 MIL/uL (ref 3.87–5.11)
RDW: 13 % (ref 11.5–15.5)
WBC: 5.4 K/uL (ref 4.0–10.5)
nRBC: 0 % (ref 0.0–0.2)

## 2024-04-27 LAB — BASIC METABOLIC PANEL WITH GFR
Anion gap: 19 — ABNORMAL HIGH (ref 5–15)
BUN: 18 mg/dL (ref 6–20)
CO2: 24 mmol/L (ref 22–32)
Calcium: 9.8 mg/dL (ref 8.9–10.3)
Chloride: 97 mmol/L — ABNORMAL LOW (ref 98–111)
Creatinine, Ser: 0.72 mg/dL (ref 0.44–1.00)
GFR, Estimated: >60 mL/min (ref >60)
Glucose, Bld: 109 mg/dL — ABNORMAL HIGH (ref 70–99)
Potassium: 3.4 mmol/L — ABNORMAL LOW (ref 3.5–5.1)
Sodium: 140 mmol/L (ref 135–145)

## 2024-04-27 LAB — TROPONIN T, HIGH SENSITIVITY
Troponin T High Sensitivity: <15 ng/L (ref 0–19)
Troponin T High Sensitivity: <15 ng/L (ref 0–19)

## 2024-04-27 LAB — POC URINE PREG, ED: Preg Test, Ur: NEGATIVE

## 2024-04-27 LAB — HCG, QUANTITATIVE, PREGNANCY: hCG, Beta Chain, Quant, S: <1 m[IU]/mL (ref <5)

## 2024-04-27 MED ORDER — IOHEXOL 350 MG/ML SOLN
100.0000 mL | Freq: Once | INTRAVENOUS | Status: AC | PRN
  Administered 2024-04-27: 75 mL via INTRAVENOUS

## 2024-04-27 MED ORDER — SODIUM CHLORIDE 0.9 % IV BOLUS
1000.0000 mL | Freq: Once | INTRAVENOUS | Status: AC
  Administered 2024-04-27: 1000 mL via INTRAVENOUS

## 2024-04-27 MED ORDER — FENTANYL CITRATE (PF) 50 MCG/ML IJ SOSY
50.0000 ug | PREFILLED_SYRINGE | Freq: Once | INTRAMUSCULAR | Status: AC
  Administered 2024-04-27: 50 ug via INTRAVENOUS
  Filled 2024-04-27: qty 1

## 2024-04-27 MED ORDER — LORAZEPAM 2 MG/ML IJ SOLN
1.0000 mg | Freq: Once | INTRAMUSCULAR | Status: AC
  Administered 2024-04-27: 1 mg via INTRAVENOUS
  Filled 2024-04-27: qty 1

## 2024-04-27 NOTE — ED Triage Notes (Addendum)
 Pt reports central chest pain since Friday. Also reports shob. Reports pain is worse when she breathes in. Reports she also has palpitations. HR in triage 145. Also states she hasn't peed since 7am this morning despite drinking lots of water.
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
## 2024-04-27 NOTE — ED Provider Notes (Signed)
 [facility] Provider Note    Event Date/Time   First MD Initiated Contact with Patient 04/27/24 2032     (approximate)   History   Chest Pain and Palpitations   HPI  Kimberly Hansen is a 33 y.o. female who presents to the emergency department today because concerns for chest pain and shortness of breath.  The patient states that her symptoms started 2 days ago.  She has been trying to manage them at home without any significant relief.  She describes a sense of tightness across her chest.  It is worse with deep breaths.  She has noticed her shortness of breath is worse with exertion.  She denies similar symptoms in the past.  In addition she have some complaints of continued right foot pain.  She was seen in the emergency department roughly 1 month ago diagnosed with avulsion fracture of the distal fibula as well as foot fractures.  She does state that she recently left jail and has not been followed up since then.     Physical Exam   Triage Vital Signs: ED Triage Vitals  Encounter Vitals Group     BP 04/27/24 1948 123/77     Girls Systolic BP Percentile --      Girls Diastolic BP Percentile --      Boys Systolic BP Percentile --      Boys Diastolic BP Percentile --      Pulse Rate 04/27/24 1948 (!) 145     Resp 04/27/24 1948 20     Temp 04/27/24 1948 99.3 F (37.4 C)     Temp Source 04/27/24 1948 Oral     SpO2 04/27/24 1948 98 %     Weight --      Height --      Head Circumference --      Peak Flow --      Pain Score 04/27/24 1949 10     Pain Loc --      Pain Education --      Exclude from Growth Chart --     Most recent vital signs: Vitals:   04/27/24 1948  BP: 123/77  Pulse: (!) 145  Resp: 20  Temp: 99.3 F (37.4 C)  SpO2: 98%   General: Awake, alert, oriented. CV:  Good peripheral perfusion. Tachycardia. Resp:  Normal effort. Lungs clear. Abd:  No distention.  Other:  Slight swelling to distal right foot. Some  discoloration.   ED Results / Procedures / Treatments   Labs (all labs ordered are listed, but only abnormal results are displayed) Labs Reviewed  BASIC METABOLIC PANEL WITH GFR - Abnormal; Notable for the following components:      Result Value   Potassium 3.4 (*)    Chloride 97 (*)    Glucose, Bld 109 (*)    Anion gap 19 (*)    All other components within normal limits  CBC  POC URINE PREG, ED  TROPONIN T, HIGH SENSITIVITY     EKG  I, Guadalupe Eagles, attending physician, personally viewed and interpreted this EKG  EKG Time: 1952 Rate: 134 Rhythm: sinus tachycardia Axis: normal Intervals: qtc 456 QRS: narrow, q waves II, III, aVF ST changes: no st elevation Impression: abnormal ekg   RADIOLOGY I independently interpreted and visualized the CXR. My interpretation: No pneumonia Radiology interpretation:  IMPRESSION:  1. No acute cardiopulmonary process.     PROCEDURES:  Critical Care performed: Yes  CRITICAL CARE Performed by: Guadalupe Eagles  Total critical care time: *** minutes  Critical care time was exclusive of separately billable procedures and treating other patients.  Critical care was necessary to treat or prevent imminent or life-threatening deterioration.  Critical care was time spent personally by me on the following activities: development of treatment plan with patient and/or surrogate as well as nursing, discussions with consultants, evaluation of patient's response to treatment, examination of patient, obtaining history from patient or surrogate, ordering and performing treatments and interventions, ordering and review of laboratory studies, ordering and review of radiographic studies, pulse oximetry and re-evaluation of patient's condition.   Procedures    MEDICATIONS ORDERED IN ED: Medications - No data to display   IMPRESSION / MDM / ASSESSMENT AND PLAN / ED COURSE  I reviewed the triage vital signs and the nursing notes.                               Differential diagnosis includes, but is not limited to, ***  Patient's presentation is most consistent with {EM COPA:27473}   ***The patient is on the cardiac monitor to evaluate for evidence of arrhythmia and/or significant heart rate changes.  ***      FINAL CLINICAL IMPRESSION(S) / ED DIAGNOSES   Final diagnoses:  None        Rx / DC Orders   ED Discharge Orders     None        Note:  This document was prepared using Dragon voice recognition software and may include unintentional dictation errors.

## 2024-04-28 LAB — URINE DRUG SCREEN
Amphetamines: NEGATIVE
Barbiturates: NEGATIVE
Benzodiazepines: NEGATIVE
Cocaine: NEGATIVE
Fentanyl: NEGATIVE
Methadone Scn, Ur: NEGATIVE
Opiates: NEGATIVE
Tetrahydrocannabinol: POSITIVE — AB

## 2024-04-28 LAB — TSH: TSH: 1.17 u[IU]/mL (ref 0.350–4.500)

## 2024-04-28 LAB — T4, FREE: Free T4: 1.73 ng/dL — ABNORMAL HIGH (ref 0.61–1.12)

## 2024-04-28 MED ORDER — LACTATED RINGERS IV BOLUS
1000.0000 mL | Freq: Once | INTRAVENOUS | Status: AC
  Administered 2024-04-28: 1000 mL via INTRAVENOUS

## 2024-04-28 NOTE — ED Provider Notes (Signed)
-----------------------------------------   12:24 AM on 04/28/2024 -----------------------------------------  Blood pressure 122/88, pulse (!) 115, temperature 98.2 F (36.8 C), resp. rate 18, SpO2 96%.   Assuming care from Dr. Floy.  In short, Kimberly Hansen is a 33 y.o. female with a chief complaint of tachycardia.  Refer to the original H&P for additional details.  The current plan of care is to follow up after another liter of fluid and UDS results.   Clinical Course as of 04/28/24 0340  Mon Apr 28, 2024  0145 I reassessed the patient.  While she is asleep, her heart rate was about 112-115.  I woke her up from a deep sleep and it jumped up into the 120s and stayed relatively steady at about 120.  She said that she feels better, just sleepy.  Unfortunately her arm has been bent so she has not gotten much of the second liter.  I straightened out her arm and encouraged her to try to keep it straight.  I considered hospitalization but I see little benefit at this point given that she has had a thorough workup with no clear indication that she would benefit from hospitalization.  Her initial anion gap was slightly elevated but she is going to receive a total of 2 L of fluid.  Her urine drug screen tested positive only for cannabinoids which theoretically could explain the tachycardia although that seems less likely than if she tested positive for a stimulant.  She agrees with the plan to complete the fluids and then I will discharge her and refer her to cardiology for follow-up.  I am also going to add on a free T4 and TSH level while she is here. [CF]  [id number] Although the patient's free T4 is somewhat elevated at 7.13, her TSH is well within normal limits and not at all suppressed which 1 would expect with overt hyperthyroidism.  Given no TSH suppression, I continue to feel the patient is safe for discharge but this additional information can be of help with her outpatient evaluation. [CF]     Clinical Course User Index [CF] Gordan Huxley, MD     Medications  sodium chloride  0.9 % bolus 1,000 mL (0 mLs Intravenous Stopped 04/27/24 2315)  fentaNYL (SUBLIMAZE) injection 50 mcg (50 mcg Intravenous Given 04/27/24 2110)  iohexol  (OMNIPAQUE ) 350 MG/ML injection 100 mL (75 mLs Intravenous Contrast Given 04/27/24 2131)  LORazepam (ATIVAN) injection 1 mg (1 mg Intravenous Given 04/27/24 2313)  lactated ringers  bolus 1,000 mL (0 mLs Intravenous Stopped 04/28/24 0246)     ED Discharge Orders          Ordered    Ambulatory Referral to Primary Care (Establish Care)        04/28/24 0236    Ambulatory referral to Cardiology       Comments: If you have not heard from the Cardiology office within the next 72 hours please call [phone].   04/28/24 0236           Final diagnoses:  Atypical chest pain  Sinus tachycardia  Elevated serum free T4 level     Gordan Huxley, MD 04/28/24 [phone]

## 2024-04-28 NOTE — ED Notes (Addendum)
 Pt assisted to toilet and back to bed. Pt HR still very elevated. Pt disgruntled about not receiving anything for pain. Pt was educated and informed she has received fentanyl  and ativan . Pt asked for demorol or stadol. Pt advised we do not typically utilize those meds down here.  Pt was offered tylenol  but she refused.

## 2024-04-28 NOTE — Discharge Instructions (Addendum)
 As we discussed, your evaluation was generally reassuring and did not identify a specific cause of your fast heart rate.  We encourage you to drink plenty of fluids and continue taking your regular medications.  The only notable lab abnormality was that one of your thyroid test was a little bit elevated, but this by itself is unlikely to be the reason for your rapid heart rate.  We have referred you to both the primary care system and the cardiology team who will both reach out to you to schedule a follow-up appointment, which we encourage you to do.    Return to the emergency department if you develop new or worsening symptoms that concern you.

## 2024-04-29 ENCOUNTER — Ambulatory Visit: Payer: MEDICAID

## 2024-04-29 ENCOUNTER — Ambulatory Visit: Payer: MEDICAID | Attending: Cardiovascular Disease | Admitting: Cardiovascular Disease

## 2024-04-29 ENCOUNTER — Encounter: Payer: Self-pay | Admitting: Cardiovascular Disease

## 2024-04-29 VITALS — BP 110/70 | HR 87 | Ht 60.0 in | Wt 135.0 lb

## 2024-04-29 DIAGNOSIS — R002 Palpitations: Secondary | ICD-10-CM | POA: Insufficient documentation

## 2024-04-29 DIAGNOSIS — I10 Essential (primary) hypertension: Secondary | ICD-10-CM | POA: Insufficient documentation

## 2024-04-29 DIAGNOSIS — R000 Tachycardia, unspecified: Secondary | ICD-10-CM | POA: Diagnosis not present

## 2024-04-29 DIAGNOSIS — R0789 Other chest pain: Secondary | ICD-10-CM | POA: Insufficient documentation

## 2024-04-29 NOTE — Patient Instructions (Signed)
 Medication Instructions:  Your physician recommends that you continue on your current medications as directed. Please refer to the Current Medication list given to you today.  *If you need a refill on your cardiac medications before your next appointment, please call your pharmacy*  Lab Work: No labs ordered today    Testing/Procedures: Your physician has requested that you have an echocardiogram. Echocardiography is a painless test that uses sound waves to create images of your heart. It provides your doctor with information about the size and shape of your heart and how well your heart's chambers and valves are working.   You may receive an ultrasound enhancing agent through an IV if needed to better visualize your heart during the echo. This procedure takes approximately one hour.  There are no restrictions for this procedure.  This will take place at [address] [facility] [address], [place] [address]  Please note: We ask at that you not bring children with you during ultrasound (echo/ vascular) testing. Due to room size and safety concerns, children are not allowed in the ultrasound rooms during exams. Our front office staff cannot provide observation of children in our lobby area while testing is being conducted. An adult accompanying a patient to their appointment will only be allowed in the ultrasound room at the discretion of the ultrasound technician under special circumstances. We apologize for any inconvenience.   ZIO XT- Long Term Monitor Instructions  Your physician has requested you wear a ZIO patch monitor for 14 days.  This is a single patch monitor. [employer] supplies one patch monitor per enrollment. Additional stickers are not available. Please do not apply patch if you will be having a Nuclear Stress Test, Echocardiogram, Cardiac CT, MRI, or Chest Xray during the period you would be wearing the monitor. The patch cannot be worn during these tests. You cannot  remove and re-apply the ZIO XT patch monitor.  Your ZIO patch monitor will be mailed 3 day USPS to your address on file. It may take 3-5 days to receive your monitor after you have been enrolled. Once you have received your monitor, please review the enclosed instructions. Your monitor has already been registered assigning a specific monitor serial number to you.  Billing and Patient Assistance Program Information  We have supplied [employer] with any of your insurance information on file for billing purposes.  [employer] offers a sliding scale Patient Assistance Program for patients that do not have insurance, or whose insurance does not completely cover the cost of the ZIO monitor.  You must apply for the Patient Assistance Program to qualify for this discounted rate.  To apply, please call [employer] at [phone], select option 4, select option 2, ask to apply for Patient Assistance Program. Meredeth will ask your household income, and how many people are in your household. They will quote your out-of-pocket cost based on that information. [employer] will also be able to set up a 64-month, interest-free payment plan if needed.  Applying the monitor   Shave hair from upper left chest.  Hold abrader disc by orange tab. Rub abrader in 40 strokes over the upper left chest as indicated in your monitor instructions.  Clean area with 4 enclosed alcohol pads. Let dry.  Apply patch as indicated in monitor instructions. Patch will be placed under collarbone on left side of chest with arrow pointing upward.  Rub patch adhesive wings for 2 minutes. Remove white label marked 1. Remove the white label marked 2. Rub patch  adhesive wings for 2 additional minutes.  While looking in a mirror, press and release button in center of patch. A small green light will flash 3-4 times. This will be your only indicator that the monitor has been turned on.  Do not shower for the first 24 hours. You may shower after the first 24  hours.  Press the button if you feel a symptom. You will hear a small click. Record Date, Time and Symptom in the Patient Logbook.  When you are ready to remove the patch, follow instructions on the last 2 pages of Patient Logbook.  Stick patch monitor into the tabs at the bottom of the return box.  Place Patient Logbook in the blue and white box. Use locking tab on box and tape box closed securely. The blue and white box has prepaid postage on it. Please place it in the mailbox as soon as possible. Your physician should have your test results approximately 7-14 days after the monitor has been mailed back to [facility].  Call [facility] Customer Care at [phone] if you have questions regarding your ZIO XT patch monitor.  Call them immediately if you see an orange light blinking on your monitor.  If your monitor falls off in less than 4 days, contact our Monitor department at [phone].  If your monitor becomes loose or falls off after 4 days call [employer] at [phone] for suggestions on securing your monitor.   Follow-Up: At [facility], you and your health needs are our priority.  As part of our continuing mission to provide you with exceptional heart care, our providers are all part of one team.  This team includes your primary Cardiologist (physician) and Advanced Practice Providers or APPs (Physician Assistants and Nurse Practitioners) who all work together to provide you with the care you need, when you need it.  Your next appointment:   2 month(s)  Provider:   You will see one of the following Advanced Practice Providers on your designated Care Team:   Lonni Meager, NP Lesley Maffucci, PA-C Bernardino Bring, PA-C Cadence Vergennes, PA-C Tylene Lunch, NP Barnie Hila, NP

## 2024-04-29 NOTE — Progress Notes (Unsigned)
 Cardiology Office Note   Date:  04/29/2024   ID:  Kimberly Hansen, DOB [birth year], MRN [id number]  PCP:  Osa Geralds, NP  Cardiologist:   Deatrice Cage, MD   Chief Complaint  Patient presents with   New Patient (Initial Visit)    Sinus tachycardia c/o chest tightness and discomfort. Meds reviewed verbally with pt.      History of Present Illness: Kimberly Hansen is a 33 y.o. female who was referred from [facility] ED for evaluation of chest pain and palpitations.  She has no prior cardiac history.  She has known history of essential hypertension and tobacco use. She is wearing a brace for a right ankle fracture.  She has no family history of premature coronary artery disease. She reports intermittent episodes of substernal chest tightness that can last anywhere from 5 minutes to an hour and is associated with shortness of breath.  It is worse with exertion or bending but can also happen at rest.  This is frequently associated with a sudden increase in her heart rate and a sudden drop.  She describes daily palpitations. She went to the emergency room for evaluation recently and had negative labs including troponin.  She was noted to be tachycardic.  CTA of the chest showed no evidence of pulmonary embolism.    Past Medical History:  Diagnosis Date   Hernia, hiatal    Hiatal hernia    Hypertension    Ovarian cyst     Past Surgical History:  Procedure Laterality Date   DRUG INDUCED ENDOSCOPY     for hernia   ORTHOPEDIC SURGERY Left    tibial fracture     Current Outpatient Medications  Medication Sig Dispense Refill   alum & mag hydroxide-simeth (MAALOX MULTI SYMPTOM MAX ST) 400-400-40 MG/5ML suspension Take 10 mLs by mouth every 6 (six) hours as needed for indigestion. 355 mL 0   amLODipine  (NORVASC ) 5 MG tablet Take 5 mg by mouth daily.     calcium carbonate (OS-CAL) 1250 (500 Ca) MG chewable tablet Chew by mouth.     etonogestrel  (NEXPLANON ) 68 MG IMPL  implant 1 each by Subdermal route once.     gabapentin (NEURONTIN) 100 MG capsule Take 100 mg by mouth 3 (three) times daily. (Patient not taking: Reported on 01/29/2024)     gabapentin (NEURONTIN) 300 MG capsule Take 300 mg by mouth at bedtime as needed. (Patient not taking: Reported on 01/29/2024)     losartan (COZAAR) 50 MG tablet Take 1 tablet by mouth daily.     losartan-hydrochlorothiazide (HYZAAR) 50-12.5 MG tablet      ondansetron  (ZOFRAN -ODT) 4 MG disintegrating tablet Take 1 tablet (4 mg total) by mouth every 8 (eight) hours as needed for nausea or vomiting. 20 tablet 0   sertraline (ZOLOFT) 100 MG tablet Take 150 mg by mouth daily.     sucralfate  (CARAFATE ) 1 GM/10ML suspension Take 1 g by mouth.     traMADol  (ULTRAM ) 50 MG tablet Take by mouth. (Patient not taking: Reported on 01/29/2024)     No current facility-administered medications for this visit.    Allergies:   Peanut-containing drug products, Bee venom, and Watermelon [citrullus vulgaris]    Social History:  The patient  reports that she has been smoking e-cigarettes. She has never used smokeless tobacco. She reports that she does not currently use alcohol after a past usage of about 4.0 standard drinks of alcohol per week. She reports current drug use. Drug: Marijuana.  Family History:  The patient's family history includes Asthma in her brother and daughter; Breast cancer in her maternal grandmother; Diabetes in her maternal grandfather and mother; Migraines in her mother; Other in her maternal grandfather and mother.    ROS:  Please see the history of present illness.   Otherwise, review of systems are positive for none.   All other systems are reviewed and negative.    PHYSICAL EXAM: VS:  BP 110/70 (BP Location: Right Arm, Patient Position: Sitting, Cuff Size: Normal)   Pulse 87   Ht 5' (1.524 m)   Wt 135 lb (61.2 kg)   SpO2 98%   BMI 26.37 kg/m  , BMI Body mass index is 26.37 kg/m. GEN: Well nourished, well  developed, in no acute distress  HEENT: normal  Neck: no JVD, carotid bruits, or masses Cardiac: RRR; no murmurs, rubs, or gallops,no edema  Respiratory:  clear to auscultation bilaterally, normal work of breathing GI: soft, nontender, nondistended, + BS MS: no deformity or atrophy  Skin: warm and dry, no rash Neuro:  Strength and sensation are intact Psych: euthymic mood, full affect   EKG:  EKG is ordered today. The ekg ordered today demonstrates : Normal sinus rhythm Normal ECG When compared with ECG of 27-Apr-2024 19:52, Vent. rate has decreased BY  47 BPM    Recent Labs: 04/27/2024: BUN 18; Creatinine, Ser 0.72; Hemoglobin 14.7; Platelets 374; Potassium 3.4; Sodium 140; TSH 1.170    Lipid Panel No results found for: CHOL, TRIG, HDL, CHOLHDL, VLDL, LDLCALC, LDLDIRECT    Wt Readings from Last 3 Encounters:  04/29/24 135 lb (61.2 kg)  03/28/24 130 lb (59 kg)  01/29/24 141 lb (64 kg)          04/29/2024    3:03 PM  PAD Screen  Previous PAD dx? No  Previous surgical procedure? Yes  Dates of procedures ankle surgery left  Pain with walking? No  Feet/toe relief with dangling? No  Painful, non-healing ulcers? No  Extremities discolored? No      ASSESSMENT AND PLAN:  1.  Atypical chest pain with associated palpitations: Unlikely to be due to ischemic heart disease.  She reports significant shortness of breath as well.  Will obtain an echocardiogram to ensure no structural heart abnormalities. If her symptoms persist and no explanation based on the above testing, treadmill stress test can be considered once her right ankle fracture heals.  2.  Palpitations and tachycardia: She reports symptoms that are happening on a daily basis.  I requested a 2-week ZIO monitor.  3.  Essential hypertension: Blood pressure is controlled on current medications.  4.  Tobacco use: She only smokes 1 to 2 cigarettes a day.  Smoking cessation is advised.  Disposition:    FU in 2 months.  Signed,  Deatrice Cage, MD  04/29/2024 3:13 PM    [facility]

## 2024-05-02 ENCOUNTER — Encounter (INDEPENDENT_AMBULATORY_CARE_PROVIDER_SITE_OTHER)

## 2024-05-02 ENCOUNTER — Ambulatory Visit (INDEPENDENT_AMBULATORY_CARE_PROVIDER_SITE_OTHER): Admitting: Vascular Surgery

## 2024-05-08 ENCOUNTER — Ambulatory Visit (INDEPENDENT_AMBULATORY_CARE_PROVIDER_SITE_OTHER): Payer: MEDICAID | Admitting: Nurse Practitioner

## 2024-05-08 ENCOUNTER — Other Ambulatory Visit (INDEPENDENT_AMBULATORY_CARE_PROVIDER_SITE_OTHER): Payer: MEDICAID

## 2024-05-08 ENCOUNTER — Encounter (INDEPENDENT_AMBULATORY_CARE_PROVIDER_SITE_OTHER): Payer: Self-pay | Admitting: Nurse Practitioner

## 2024-05-08 VITALS — BP 159/121 | HR 70 | Resp 18 | Ht 60.0 in | Wt 142.4 lb

## 2024-05-08 DIAGNOSIS — I871 Compression of vein: Secondary | ICD-10-CM | POA: Diagnosis not present

## 2024-05-08 DIAGNOSIS — I10 Essential (primary) hypertension: Secondary | ICD-10-CM

## 2024-05-19 ENCOUNTER — Encounter (INDEPENDENT_AMBULATORY_CARE_PROVIDER_SITE_OTHER): Payer: Self-pay | Admitting: Nurse Practitioner

## 2024-05-19 NOTE — Progress Notes (Signed)
 "  Subjective:    Patient ID: Pieter KATHEE Schimke, female    DOB: 06/29/1990, 33 y.o.   MRN: [id number] Chief Complaint  Patient presents with   Follow-up    3-4 month follow up + msenteric/LRV ref. JD    HPI  Discussed the use of AI scribe software for clinical note transcription with the patient, who gave verbal consent to proceed.  History of Present Illness Zemirah B Norby is a 33 year old female who presents with abdominal pain and gastrointestinal symptoms.  She experiences persistent upper abdominal pain located in the midsection, which has remained unchanged since her last visit.  She reports diarrhea, which she associates with her menstrual cycle, but it has been occurring at night for the past two to three nights. Occasional nausea occurs, particularly when she is very hungry or overly full, but there is no vomiting. She has noticed red, stringy pieces in her stool, which she initially thought might be blood, although the stool is otherwise normal in color.  The patient reports that a prior ultrasound showed normal aorta size and mesenteric vessels. The ultrasound showed mild compression of the left renal vein. The ultrasound noted that her liver was larger than normal in size.  She is not currently employed and is considering her options regarding having more children.    Results RADIOLOGY Abdominal Ultrasound: Normal aorta size, normal mesenteric vessels, mild compression of left renal vein with turbulent flow, possible mild hepatomegaly. (05/08/2024)   Review of Systems     Objective:   Physical Exam  Physical Exam    BP (!) 159/121 (BP Location: Left Arm, Patient Position: Sitting, Cuff Size: Normal) Comment: pt has not taken BP meds today  Pulse 70   Resp 18   Ht 5' (1.524 m)   Wt 142 lb 6.4 oz (64.6 kg)   BMI 27.81 kg/m   Past Medical History:  Diagnosis Date   Hernia, hiatal    Hiatal hernia    Hypertension    Nutcracker phenomenon of renal  vein    Ovarian cyst     Social History   Socioeconomic History   Marital status: Single    Spouse name: Not on file   Number of children: Not on file   Years of education: Not on file   Highest education level: Not on file  Occupational History   Not on file  Tobacco Use   Smoking status: Every Day    Types: E-cigarettes   Smokeless tobacco: Never  Vaping Use   Vaping status: Some Days   Substances: Nicotine, CBD, Flavoring  Substance and Sexual Activity   Alcohol use: Not Currently    Alcohol/week: 4.0 standard drinks of alcohol    Types: 4 Shots of liquor per week    Comment: last use 11/11/21 quit because was daily   Drug use: Yes    Types: Marijuana   Sexual activity: Yes    Partners: Male    Birth control/protection: Implant, Condom    Comment: Condoms always  Other Topics Concern   Not on file  Social History Narrative   Not on file   Social Drivers of Health   Tobacco Use: High Risk (05/19/2024)   Patient History    Smoking Tobacco Use: Every Day    Smokeless Tobacco Use: Never    Passive Exposure: Not on file  Financial Resource Strain: Low Risk  (10/08/2023)   Received from [facility]   Overall Financial Resource Strain (CARDIA)  Difficulty of Paying Living Expenses: Not very hard  Food Insecurity: No Food Insecurity (10/08/2023)   Received from [facility]   Epic    Within the past 12 months, you worried that your food would run out before you got the money to buy more.: Never true    Within the past 12 months, the food you bought just didn't last and you didn't have money to get more.: Never true  Transportation Needs: Unmet Transportation Needs (10/08/2023)   Received from [facility]   PRAPARE - Transportation    In the past 12 months, has lack of transportation kept you from medical appointments or from getting medications?: Yes    Lack of Transportation (Non-Medical): Yes  Physical Activity:  Not on file  Stress: Not on file  Social Connections: Not on file  Intimate Partner Violence: Not At Risk (02/09/2022)   Humiliation, Afraid, Rape, and Kick questionnaire    Fear of Current or Ex-Partner: No    Emotionally Abused: No    Physically Abused: No    Sexually Abused: No  Depression (PHQ2-9): Low Risk (02/09/2022)   Depression (PHQ2-9)    PHQ-2 Score: 1  Alcohol Screen: Not on file  Housing: High Risk (10/08/2023)   Received from [facility]   Epic    In the last 12 months, was there a time when you were not able to pay the mortgage or rent on time?: Yes    Number of Times Moved in the Last Year: Not on file    At any time in the past 12 months, were you homeless or living in a shelter (including now)?: Yes  Utilities: Not At Risk (10/08/2023)   Received from [facility]    Threatened with loss of utilities: No  Health Literacy: Not on file    Past Surgical History:  Procedure Laterality Date   DRUG INDUCED ENDOSCOPY     for hernia   ORTHOPEDIC SURGERY Left    tibial fracture    Family History  Problem Relation Age of Onset   Breast cancer Maternal Grandmother    Other Maternal Grandfather        benign brain tumor   Diabetes Maternal Grandfather    Other Mother        benign brain tumor   Diabetes Mother    Migraines Mother    Asthma Brother    Asthma Daughter     Allergies[1]     Latest Ref Rng & Units 04/27/2024    7:57 PM 01/11/2024   11:30 AM 02/28/2023    9:05 PM  CBC  WBC 4.0 - 10.5 K/uL 5.4  6.9  9.9   Hemoglobin 12.0 - 15.0 g/dL 85.2  87.2  87.1   Hematocrit 36.0 - 46.0 % 43.7  36.7  38.8   Platelets 150 - 400 K/uL 374  423  412       CMP     Component Value Date/Time   NA 140 04/27/2024 1957   NA 139 12/09/2013 0018   K 3.4 (L) 04/27/2024 1957   K 3.4 (L) 12/09/2013 0018   CL 97 (L) 04/27/2024 1957   CL 108 (H) 12/09/2013 0018   CO2 24 04/27/2024 1957   CO2 25 12/09/2013 0018    GLUCOSE 109 (H) 04/27/2024 1957   GLUCOSE 94 12/09/2013 0018   BUN 18 04/27/2024 1957   BUN 6 (L) 12/09/2013 0018   CREATININE  0.72 04/27/2024 1957   CREATININE 0.75 12/09/2013 0018   CALCIUM 9.8 04/27/2024 1957   CALCIUM 8.4 (L) 12/09/2013 0018   PROT 8.9 (H) 02/28/2023 2105   PROT 9.9 (H) 04/28/2013 2156   ALBUMIN 5.1 (H) 02/28/2023 2105   ALBUMIN 5.3 (H) 04/28/2013 2156   AST 23 02/28/2023 2105   AST 24 04/28/2013 2156   ALT 17 02/28/2023 2105   ALT 23 04/28/2013 2156   ALKPHOS 81 02/28/2023 2105   ALKPHOS 83 04/28/2013 2156   BILITOT 1.1 02/28/2023 2105   BILITOT 0.9 04/28/2013 2156   GFRNONAA >60 04/27/2024 1957   GFRNONAA >60 12/09/2013 0018     No results found.     Assessment & Plan:   1. 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 Surgical intervention not recommended due to lack of symptoms and potential complications. - Follow up with gastroenterology for liver enlargement. - Annual ultrasound to monitor nutcracker phenomenon. - Monitor for hematuria or flank pain. - Discussed potential impact of pregnancy on nutcracker phenomenon. - VAS US  MESENTERIC; Future  Assessment and Plan Assessment & Plan      Medications Ordered Prior to Encounter[2]  There are no Patient Instructions on file for this visit. Return in about 1 year (around 05/08/2025) for Mesenteric to see JD .   Edith Lord E Ameya Vowell, NP      [1]  Allergies Allergen Reactions   Peanut-Containing Drug Products Anaphylaxis   Bee Venom    Watermelon [Citrullus Vulgaris] Hives and Swelling    Most melons, peaches, nectorines.  Has hives and can have lip swelling.  [2]  Current Outpatient Medications on File Prior to Visit  Medication Sig Dispense Refill   alum & mag hydroxide-simeth (MAALOX MULTI SYMPTOM MAX ST) 400-400-40 MG/5ML suspension Take 10 mLs by mouth  every 6 (six) hours as needed for indigestion. 355 mL 0   amLODipine  (NORVASC ) 5 MG tablet Take 5 mg by mouth daily.     calcium carbonate (OS-CAL) 1250 (500 Ca) MG chewable tablet Chew by mouth.     etonogestrel  (NEXPLANON ) 68 MG IMPL implant 1 each by Subdermal route once.     gabapentin (NEURONTIN) 100 MG capsule Take 100 mg by mouth 3 (three) times daily.     gabapentin (NEURONTIN) 300 MG capsule Take 300 mg by mouth at bedtime as needed.     losartan (COZAAR) 50 MG tablet Take 1 tablet by mouth daily.     losartan-hydrochlorothiazide (HYZAAR) 50-12.5 MG tablet      ondansetron  (ZOFRAN -ODT) 4 MG disintegrating tablet Take 1 tablet (4 mg total) by mouth every 8 (eight) hours as needed for nausea or vomiting. 20 tablet 0   sertraline (ZOLOFT) 100 MG tablet Take 150 mg by mouth daily.     sucralfate  (CARAFATE ) 1 GM/10ML suspension Take 1 g by mouth.     traMADol  (ULTRAM ) 50 MG tablet Take by mouth.     No current facility-administered medications on file prior to visit.   "

## 2024-06-10 ENCOUNTER — Ambulatory Visit: Payer: MEDICAID

## 2024-06-25 ENCOUNTER — Ambulatory Visit: Payer: MEDICAID

## 2024-07-14 ENCOUNTER — Ambulatory Visit: Payer: MEDICAID

## 2025-05-08 ENCOUNTER — Ambulatory Visit (INDEPENDENT_AMBULATORY_CARE_PROVIDER_SITE_OTHER): Admitting: Nurse Practitioner

## 2025-05-08 ENCOUNTER — Encounter (INDEPENDENT_AMBULATORY_CARE_PROVIDER_SITE_OTHER)
